# Patient Record
Sex: Female | Born: 1985 | Race: Black or African American | Hispanic: No | Marital: Married | Smoking: Never smoker
Health system: Southern US, Community
[De-identification: ages and names within clinical notes are randomized; demographics above are authoritative.]

## PROBLEM LIST (undated history)

## (undated) ENCOUNTER — Inpatient Hospital Stay (HOSPITAL_COMMUNITY): Payer: Self-pay

## (undated) DIAGNOSIS — O139 Gestational [pregnancy-induced] hypertension without significant proteinuria, unspecified trimester: Secondary | ICD-10-CM

## (undated) DIAGNOSIS — E119 Type 2 diabetes mellitus without complications: Secondary | ICD-10-CM

## (undated) DIAGNOSIS — O24419 Gestational diabetes mellitus in pregnancy, unspecified control: Secondary | ICD-10-CM

## (undated) DIAGNOSIS — G44009 Cluster headache syndrome, unspecified, not intractable: Secondary | ICD-10-CM

## (undated) DIAGNOSIS — O09299 Supervision of pregnancy with other poor reproductive or obstetric history, unspecified trimester: Secondary | ICD-10-CM

## (undated) HISTORY — DX: Type 2 diabetes mellitus without complications: E11.9

---

## 2011-01-26 DIAGNOSIS — O139 Gestational [pregnancy-induced] hypertension without significant proteinuria, unspecified trimester: Secondary | ICD-10-CM

## 2015-09-03 ENCOUNTER — Encounter (HOSPITAL_COMMUNITY): Payer: Self-pay | Admitting: *Deleted

## 2015-09-03 ENCOUNTER — Inpatient Hospital Stay (HOSPITAL_COMMUNITY)
Admission: AD | Admit: 2015-09-03 | Discharge: 2015-09-04 | Disposition: A | Discharge status: Home / Self Care | Payer: Medicaid Other | Source: Ambulatory Visit | Attending: Obstetrics and Gynecology | Admitting: Obstetrics and Gynecology

## 2015-09-03 DIAGNOSIS — R51 Headache: Secondary | ICD-10-CM | POA: Insufficient documentation

## 2015-09-03 DIAGNOSIS — Z3A09 9 weeks gestation of pregnancy: Secondary | ICD-10-CM | POA: Insufficient documentation

## 2015-09-03 DIAGNOSIS — O219 Vomiting of pregnancy, unspecified: Secondary | ICD-10-CM

## 2015-09-03 DIAGNOSIS — O210 Mild hyperemesis gravidarum: Secondary | ICD-10-CM | POA: Diagnosis not present

## 2015-09-03 DIAGNOSIS — G44209 Tension-type headache, unspecified, not intractable: Secondary | ICD-10-CM | POA: Diagnosis not present

## 2015-09-03 HISTORY — DX: Gestational (pregnancy-induced) hypertension without significant proteinuria, unspecified trimester: O13.9

## 2015-09-03 LAB — COMPREHENSIVE METABOLIC PANEL
ALBUMIN: 4.2 g/dL (ref 3.5–5.0)
ALK PHOS: 49 U/L (ref 38–126)
ALT: 14 U/L (ref 14–54)
AST: 23 U/L (ref 15–41)
Anion gap: 7 (ref 5–15)
BILIRUBIN TOTAL: 0.7 mg/dL (ref 0.3–1.2)
BUN: 9 mg/dL (ref 6–20)
CO2: 22 mmol/L (ref 22–32)
CREATININE: 0.51 mg/dL (ref 0.44–1.00)
Calcium: 8.5 mg/dL — ABNORMAL LOW (ref 8.9–10.3)
Chloride: 104 mmol/L (ref 101–111)
GFR calc Af Amer: >60 mL/min (ref >60)
GFR calc non Af Amer: >60 mL/min (ref >60)
GLUCOSE: 90 mg/dL (ref 65–99)
POTASSIUM: 4.1 mmol/L (ref 3.5–5.1)
Sodium: 133 mmol/L — ABNORMAL LOW (ref 135–145)
TOTAL PROTEIN: 7.7 g/dL (ref 6.5–8.1)

## 2015-09-03 LAB — CBC WITH DIFFERENTIAL/PLATELET
BASOS PCT: 1 %
Basophils Absolute: 0 10*3/uL (ref 0.0–0.1)
Eosinophils Absolute: 0 10*3/uL (ref 0.0–0.7)
Eosinophils Relative: 0 %
HEMATOCRIT: 36.5 % (ref 36.0–46.0)
HEMOGLOBIN: 12.2 g/dL (ref 12.0–15.0)
LYMPHS PCT: 41 %
Lymphs Abs: 2.2 10*3/uL (ref 0.7–4.0)
MCH: 26.2 pg (ref 26.0–34.0)
MCHC: 33.4 g/dL (ref 30.0–36.0)
MCV: 78.5 fL (ref 78.0–100.0)
MONO ABS: 0.4 10*3/uL (ref 0.1–1.0)
Monocytes Relative: 7 %
NEUTROS ABS: 2.7 10*3/uL (ref 1.7–7.7)
NEUTROS PCT: 51 %
Platelets: 259 10*3/uL (ref 150–400)
RBC: 4.65 MIL/uL (ref 3.87–5.11)
RDW: 14.1 % (ref 11.5–15.5)
WBC: 5.4 10*3/uL (ref 4.0–10.5)

## 2015-09-03 LAB — URINALYSIS, ROUTINE W REFLEX MICROSCOPIC
Bilirubin Urine: NEGATIVE
Glucose, UA: NEGATIVE mg/dL
HGB URINE DIPSTICK: NEGATIVE
Ketones, ur: 40 mg/dL — AB
LEUKOCYTES UA: NEGATIVE
Nitrite: NEGATIVE
PROTEIN: NEGATIVE mg/dL
UROBILINOGEN UA: 0.2 mg/dL (ref 0.0–1.0)
pH: 6 (ref 5.0–8.0)

## 2015-09-03 LAB — POCT PREGNANCY, URINE: PREG TEST UR: POSITIVE — AB

## 2015-09-03 MED ORDER — PROMETHAZINE HCL 25 MG/ML IJ SOLN
25.0000 mg | Freq: Once | INTRAVENOUS | Status: AC
  Administered 2015-09-03: 25 mg via INTRAVENOUS
  Filled 2015-09-03: qty 1

## 2015-09-03 MED ORDER — LACTATED RINGERS IV BOLUS (SEPSIS)
1000.0000 mL | Freq: Once | INTRAVENOUS | Status: AC
  Administered 2015-09-03: 1000 mL via INTRAVENOUS

## 2015-09-03 MED ORDER — METOCLOPRAMIDE HCL 5 MG/ML IJ SOLN
5.0000 mg | Freq: Once | INTRAMUSCULAR | Status: AC
  Administered 2015-09-03: 5 mg via INTRAVENOUS
  Filled 2015-09-03: qty 2

## 2015-09-03 NOTE — MAU Provider Note (Signed)
History    CSN: [id number] Arrival date and time: 09/03/15 1831  Chief Complaint  Patient presents with  . Headache  . Emesis During Pregnancy   HPI Vanessa Molina is 29 y.o.  LMP 06/29/15.  [id number]. By dates she is [redacted]w[redacted]d with EDD 04/04/16.  Presents with nausea and vomiting--reported 3 X today.  Difficulty with decreased appetite and keeping foods down.  She has hx of headaches and now having them daily. She denies vaginal bleeding and abdominal pain.  She had a + HPT and confirmed at [facility]  Has Appt mid Nov to begin prenatal care.  Hx of pre eclampsia with previous pregnancy.  Moved here from [place] in August.     Past Medical History  Diagnosis Date  . Pregnancy induced hypertension     Past Surgical History  Procedure Laterality Date  . Cesarean section      x2    No family history on file.  Social History  Substance Use Topics  . Smoking status: Never Smoker   . Smokeless tobacco: None  . Alcohol Use: No    Allergies: No Known Allergies  Prescriptions prior to admission  Medication Sig Dispense Refill Last Dose  . Prenatal Vit-Fe Fumarate-FA (PRENATAL MULTIVITAMIN) TABS tablet Take 1 tablet by mouth daily at 12 noon.   09/02/2015 at Unknown time    Review of Systems  Constitutional: Negative for fever and chills.  Gastrointestinal: Positive for nausea and vomiting. Negative for abdominal pain.  Genitourinary: Negative for dysuria, urgency, frequency and hematuria.       Neg for vaginal bleeding.  Neurological: Positive for headaches.   Physical Exam   Blood pressure 109/65, pulse 86, temperature 98.1 F (36.7 C), temperature source Oral, resp. rate 18, last menstrual period 06/29/2015.  Physical Exam  Nursing note and vitals reviewed. Constitutional: She is oriented to person, place, and time. She appears well-developed and well-nourished. No distress.  Not actively vomiting.  Cardiovascular: Normal rate.   Respiratory: Effort normal.  GI: Soft. There is no  tenderness.  Genitourinary:  Deferred.  Neurological: She is alert and oriented to person, place, and time. No cranial nerve deficit.  Skin: Skin is warm and dry.  Psychiatric: She has a normal mood and affect. Her behavior is normal.    Results for orders placed or performed during the hospital encounter of 09/03/15 (from the past 24 hour(s))  Urinalysis, Routine w reflex microscopic (not at [facility])     Status: Abnormal   Collection Time: 09/03/15  7:00 PM  Result Value Ref Range   Color, Urine YELLOW YELLOW   APPearance CLEAR CLEAR   Specific Gravity, Urine >1.030 (H) 1.005 - 1.030   pH 6.0 5.0 - 8.0   Glucose, UA NEGATIVE NEGATIVE mg/dL   Hgb urine dipstick NEGATIVE NEGATIVE   Bilirubin Urine NEGATIVE NEGATIVE   Ketones, ur 40 (A) NEGATIVE mg/dL   Protein, ur NEGATIVE NEGATIVE mg/dL   Urobilinogen, UA 0.2 0.0 - 1.0 mg/dL   Nitrite NEGATIVE NEGATIVE   Leukocytes, UA NEGATIVE NEGATIVE  Pregnancy, urine POC     Status: Abnormal   Collection Time: 09/03/15  7:14 PM  Result Value Ref Range   Preg Test, Ur POSITIVE (A) NEGATIVE  CBC with Differential/Platelet     Status: None   Collection Time: 09/03/15  9:20 PM  Result Value Ref Range   WBC 5.4 4.0 - 10.5 K/uL   RBC 4.65 3.87 - 5.11 MIL/uL   Hemoglobin 12.2 12.0 - 15.0 g/dL  HCT 36.5 36.0 - 46.0 %   MCV 78.5 78.0 - 100.0 fL   MCH 26.2 26.0 - 34.0 pg   MCHC 33.4 30.0 - 36.0 g/dL   RDW 46.914.1 62.911.5 - 52.815.5 %   Platelets 259 150 - 400 K/uL   Neutrophils Relative % 51 %   Neutro Abs 2.7 1.7 - 7.7 K/uL   Lymphocytes Relative 41 %   Lymphs Abs 2.2 0.7 - 4.0 K/uL   Monocytes Relative 7 %   Monocytes Absolute 0.4 0.1 - 1.0 K/uL   Eosinophils Relative 0 %   Eosinophils Absolute 0.0 0.0 - 0.7 K/uL   Basophils Relative 1 %   Basophils Absolute 0.0 0.0 - 0.1 K/uL  Comprehensive metabolic panel     Status: Abnormal   Collection Time: 09/03/15  9:20 PM  Result Value Ref Range   Sodium 133 (L) 135 - 145 mmol/L   Potassium 4.1 3.5 -  5.1 mmol/L   Chloride 104 101 - 111 mmol/L   CO2 22 22 - 32 mmol/L   Glucose, Bld 90 65 - 99 mg/dL   BUN 9 6 - 20 mg/dL   Creatinine, Ser 4.130.51 0.44 - 1.00 mg/dL   Calcium 8.5 (L) 8.9 - 10.3 mg/dL   Total Protein 7.7 6.5 - 8.1 g/dL   Albumin 4.2 3.5 - 5.0 g/dL   AST 23 15 - 41 U/L   ALT 14 14 - 54 U/L   Alkaline Phosphatase 49 38 - 126 U/L   Total Bilirubin 0.7 0.3 - 1.2 mg/dL   GFR calc non Af Amer >60 >60 mL/min   GFR calc Af Amer >60 >60 mL/min   Anion gap 7 5 - 15   MAU Course  Procedures  MDM MSE Labs- CMP wnl. CBC wnl. UA with keytones.  IV hydration with D5LR/Phenergan 25mg .     21:00 PM [facility] KnifeWilliams[facility] CNM. 21:59 PM Transferred care to Dr. Alvester MorinNewton - In brief, patient is here with early pregnancy nausea/vomiting and HA. She is receiving IVF.  11:16 PM Patient is still very nauseated and is s/p 1L D5LR and Phenergan. Will give 1 LR bolus and Reglan 5mg  IV and reassess.  0130: Denies nausea.   Assessment and Plan  A:  Emesis during pregnancy      Headaches  P: Patient is much improved after 2 L of fluid, phenergan and reglan. She is not endorsing nausea. Discussed importance of small frequent meals and hydration. Rx for phenergan given. [facility]. If phenergan does not control nausea Rx for reglan would be warranted.  Isa RankinKimberly Niles [facility] 09/03/2015, 10:03 PM

## 2015-09-03 NOTE — MAU Note (Addendum)
C/O HA since last night, vomiting every day since she found out she was pregnant.  Pt denies abd pain or bleeding.  [facility] HPT [facility]

## 2015-09-03 NOTE — MAU Note (Signed)
Hx of severe preelampsia with previous pregnancies.

## 2015-09-04 ENCOUNTER — Telehealth (HOSPITAL_COMMUNITY): Payer: Self-pay | Admitting: *Deleted

## 2015-09-04 MED ORDER — PROMETHAZINE HCL 25 MG PO TABS: 25.0000 mg | ORAL_TABLET | Freq: Four times a day (QID) | ORAL | Status: DC | PRN

## 2015-09-04 NOTE — Discharge Instructions (Signed)
Eating Plan for Hyperemesis Gravidarum Severe cases of hyperemesis gravidarum can lead to dehydration and malnutrition. The hyperemesis eating plan is one way to lessen the symptoms of nausea and vomiting. It is often used with prescribed medicines to control your symptoms.  WHAT CAN I DO TO RELIEVE MY SYMPTOMS? Listen to your body. Everyone is different and has different preferences. Find what works best for you. Some of the following things may help:  Eat and drink slowly.  Eat 5-6 small meals daily instead of 3 large meals.   Eat crackers before you get out of bed in the morning.   Starchy foods are usually well tolerated (such as cereal, toast, bread, potatoes, pasta, rice, and pretzels).   Ginger may help with nausea. Add  tsp ground ginger to hot tea or choose ginger tea.   Try drinking 100% fruit juice or an electrolyte drink.  Continue to take your prenatal vitamins as directed by your health care provider. If you are having trouble taking your prenatal vitamins, talk with your health care provider about different options.  Include at least 1 serving of protein with your meals and snacks (such as meats or poultry, beans, nuts, eggs, or yogurt). Try eating a protein-rich snack before bed (such as cheese and crackers or a half Malaw[place]urkey or peanut butter sandwich). WHAT THINGS SHOULD I AVOID TO REDUCE MY SYMPTOMS? The following things may help reduce your symptoms:  Avoid foods with strong smells. Try eating meals in well-ventilated areas that are free of odors.  Avoid drinking water or other beverages with meals. Try not to drink anything less than 30 minutes before and after meals.  Avoid drinking more than 1 cup of fluid at a time.  Avoid fried or high-fat foods, such as butter and cream sauces.  Avoid spicy foods.  Avoid skipping meals the best you can. Nausea can be more intense on an empty stomach. If you cannot tolerate food at that time, do not force it. Try sucking on  ice chips or other frozen items and make up the calories later.  Avoid lying down within 2 hours after eating.   This information is not intended to replace advice given to you by your health care provider. Make sure you discuss any questions you have with your health care provider.   Document Released: 08/20/2007 Document Revised: 10/28/2013 Document Reviewed: 08/27/2013 Elsevier Interactive Patient Education 2016 [employer]   Safe Medications in Pregnancy   Acne: Benzoyl Peroxide Salicylic Acid  Backache/Headache: Tylenol: 2 regular strength every 4 hours OR              2 Extra strength every 6 hours  Colds/Coughs/Allergies: Benadryl (alcohol free) 25 mg every 6 hours as needed Breath right strips Claritin Cepacol throat lozenges Chloraseptic throat spray Cold-Eeze- up to three times per day Cough drops, alcohol free Flonase (by prescription only) Guaifenesin Mucinex Robitussin DM (plain only, alcohol free) Saline nasal spray/drops Sudafed (pseudoephedrine) & Actifed ** use only after [redacted] weeks gestation and if you do not have high blood pressure Tylenol Vicks Vaporub Zinc lozenges Zyrtec   Constipation: Colace Ducolax suppositories Fleet enema Glycerin suppositories Metamucil Milk of magnesia Miralax Senokot Smooth move tea  Diarrhea: Kaopectate Imodium A-D  *NO pepto Bismol  Hemorrhoids: Anusol Anusol HC Preparation H Tucks  Indigestion: Tums Maalox Mylanta Zantac  Pepcid  Insomnia: Benadryl (alcohol free) 25mg  every 6 hours as needed Tylenol PM Unisom, no Gelcaps  Leg Cramps: Tums MagGel  Nausea/Vomiting:  Bonine Dramamine  Emetrol Ginger extract Sea bands Meclizine  Nausea medication to take during pregnancy:  Unisom (doxylamine succinate 25 mg tablets) Take one tablet daily at bedtime. If symptoms are not adequately controlled, the dose can be increased to a maximum recommended dose of two tablets daily (1/2 tablet in the  morning, 1/2 tablet mid-afternoon and one at bedtime). Vitamin B6  tablets. Take one tablet twice a day (up to 200 mg per day).  Skin Rashes: Aveeno products Benadryl cream or  every 6 hours as needed Calamine Lotion 1% cortisone cream  Yeast infection: Gyne-lotrimin 7 Monistat 7   **If taking multiple medications, please check labels to avoid duplicating the same active ingredients **take medication as directed on the label ** Do not exceed 4000 mg of tylenol in 24 hours **Do not take medications that contain aspirin or ibuprofen     Hyperemesis Gravidarum Hyperemesis gravidarum is a severe form of nausea and vomiting that happens during pregnancy. Hyperemesis is worse than morning sickness. It may cause you to have nausea or vomiting all day for many days. It may keep you from eating and drinking enough food and liquids. Hyperemesis usually occurs during the first half (the first 20 weeks) of pregnancy. It often goes away once a woman is in her second half of pregnancy. However, sometimes hyperemesis continues through an entire pregnancy.  CAUSES  The cause of this condition is not completely known but is thought to be related to changes in the body's hormones when pregnant. It could be from the high level of the pregnancy hormone or an increase in estrogen in the body.  SIGNS AND SYMPTOMS   Severe nausea and vomiting.  Nausea that does not go away.  Vomiting that does not allow you to keep any food down.  Weight loss and body fluid loss (dehydration).  Having no desire to eat or not liking food you have previously enjoyed. DIAGNOSIS  Your health care provider will do a physical exam and ask you about your symptoms. He or she may also order blood tests and urine tests to make sure something else is not causing the problem.  TREATMENT  You may only need medicine to control the problem. If medicines do not control the nausea and vomiting, you will be treated in the  hospital to prevent dehydration, increased acid in the blood (acidosis), weight loss, and changes in the electrolytes in your body that may harm the unborn baby (fetus). You may need IV fluids.  HOME CARE INSTRUCTIONS   Only take over-the-counter or prescription medicines as directed by your health care provider.  Try eating a couple of dry crackers or toast in the morning before getting out of bed.  Avoid foods and smells that upset your stomach.  Avoid fatty and spicy foods.  Eat 5-6 small meals a day.  Do not drink when eating meals. Drink between meals.  For snacks, eat high-protein foods, such as cheese.  Eat or suck on things that have ginger in them. Ginger helps nausea.  Avoid food preparation. The smell of food can spoil your appetite.  Avoid iron pills and iron in your multivitamins until after 3-4 months of being pregnant. However, consult with your health care provider before stopping any prescribed iron pills. SEEK MEDICAL CARE IF:   Your abdominal pain increases.  You have a severe headache.  You have vision problems.  You are losing weight. SEEK IMMEDIATE MEDICAL CARE IF:   You are unable to keep fluids down.  You  vomit blood.  You have constant nausea and vomiting.  You have excessive weakness.  You have extreme thirst.  You have dizziness or fainting.  You have a fever or persistent symptoms for more than 2-3 days.  You have a fever and your symptoms suddenly get worse. MAKE SURE YOU:   Understand these instructions.  Will watch your condition.  Will get help right away if you are not doing well or get worse.   This information is not intended to replace advice given to you by your health care provider. Make sure you discuss any questions you have with your health care provider.   Document Released: 10/23/2005 Document Revised: 08/13/2013 Document Reviewed: 06/04/2013 Elsevier Interactive Patient Education [employer]

## 2015-09-04 NOTE — Progress Notes (Signed)
Written and verbal d/c instructions given and understanding voiced. 

## 2015-09-07 ENCOUNTER — Inpatient Hospital Stay (HOSPITAL_COMMUNITY)
Admission: AD | Admit: 2015-09-07 | Discharge: 2015-09-07 | Disposition: A | Discharge status: Home / Self Care | Payer: Medicaid Other | Source: Ambulatory Visit | Attending: Obstetrics & Gynecology | Admitting: Obstetrics & Gynecology

## 2015-09-07 ENCOUNTER — Encounter (HOSPITAL_COMMUNITY): Payer: Self-pay | Admitting: *Deleted

## 2015-09-07 ENCOUNTER — Inpatient Hospital Stay (HOSPITAL_COMMUNITY): Payer: Medicaid Other

## 2015-09-07 DIAGNOSIS — O43891 Other placental disorders, first trimester: Secondary | ICD-10-CM

## 2015-09-07 DIAGNOSIS — O209 Hemorrhage in early pregnancy, unspecified: Secondary | ICD-10-CM | POA: Diagnosis present

## 2015-09-07 DIAGNOSIS — O208 Other hemorrhage in early pregnancy: Secondary | ICD-10-CM | POA: Diagnosis not present

## 2015-09-07 DIAGNOSIS — Z3A10 10 weeks gestation of pregnancy: Secondary | ICD-10-CM | POA: Insufficient documentation

## 2015-09-07 DIAGNOSIS — N90810 Female genital mutilation status, unspecified: Secondary | ICD-10-CM | POA: Insufficient documentation

## 2015-09-07 DIAGNOSIS — O468X1 Other antepartum hemorrhage, first trimester: Secondary | ICD-10-CM

## 2015-09-07 DIAGNOSIS — O418X10 Other specified disorders of amniotic fluid and membranes, first trimester, not applicable or unspecified: Secondary | ICD-10-CM

## 2015-09-07 LAB — CBC
HCT: 37 % (ref 36.0–46.0)
Hemoglobin: 12.4 g/dL (ref 12.0–15.0)
MCH: 26.6 pg (ref 26.0–34.0)
MCHC: 33.5 g/dL (ref 30.0–36.0)
MCV: 79.4 fL (ref 78.0–100.0)
PLATELETS: 269 10*3/uL (ref 150–400)
RBC: 4.66 MIL/uL (ref 3.87–5.11)
RDW: 14 % (ref 11.5–15.5)
WBC: 5.3 10*3/uL (ref 4.0–10.5)

## 2015-09-07 LAB — URINALYSIS, ROUTINE W REFLEX MICROSCOPIC
Bilirubin Urine: NEGATIVE
Glucose, UA: NEGATIVE mg/dL
Ketones, ur: NEGATIVE mg/dL
Leukocytes, UA: NEGATIVE
NITRITE: NEGATIVE
PROTEIN: NEGATIVE mg/dL
SPECIFIC GRAVITY, URINE: 1.025 (ref 1.005–1.030)
UROBILINOGEN UA: 0.2 mg/dL (ref 0.0–1.0)
pH: 6 (ref 5.0–8.0)

## 2015-09-07 LAB — URINE MICROSCOPIC-ADD ON

## 2015-09-07 LAB — WET PREP, GENITAL
Clue Cells Wet Prep HPF POC: NONE SEEN
TRICH WET PREP: NONE SEEN
Yeast Wet Prep HPF POC: NONE SEEN

## 2015-09-07 LAB — ABO/RH: ABO/RH(D): O POS

## 2015-09-07 LAB — HCG, QUANTITATIVE, PREGNANCY: HCG, BETA CHAIN, QUANT, S: 39140 m[IU]/mL — AB (ref <5)

## 2015-09-07 NOTE — MAU Note (Signed)
Not in the lobby 

## 2015-09-07 NOTE — MAU Note (Signed)
Urine in lab 

## 2015-09-07 NOTE — Discharge Instructions (Signed)
Subchorionic Hematoma °A subchorionic hematoma is a gathering of blood between the outer wall of the placenta and the inner wall of the womb (uterus). The placenta is the organ that connects the fetus to the wall of the uterus. The placenta performs the feeding, breathing (oxygen to the fetus), and waste removal (excretory work) of the fetus.  °Subchorionic hematoma is the most common abnormality found on a result from ultrasonography done during the first trimester or early second trimester of pregnancy. If there has been little or no vaginal bleeding, early small hematomas usually shrink on their own and do not affect your baby or pregnancy. The blood is gradually absorbed over 1-2 weeks. When bleeding starts later in pregnancy or the hematoma is larger or occurs in an older pregnant woman, the outcome may not be as good. Larger hematomas may get bigger, which increases the chances for miscarriage. Subchorionic hematoma also increases the risk of premature detachment of the placenta from the uterus, preterm (premature) labor, and stillbirth. °HOME CARE INSTRUCTIONS °· Stay on bed rest if your health care provider recommends this. Although bed rest will not prevent more bleeding or prevent a miscarriage, your health care provider may recommend bed rest until you are advised otherwise. °· Avoid heavy lifting (more than 10 lb [4.5 kg]), exercise, sexual intercourse, or douching as directed by your health care provider. °· Keep track of the number of pads you use each day and how soaked (saturated) they are. Write down this information. °· Do not use tampons. °· Keep all follow-up appointments as directed by your health care provider. Your health care provider may ask you to have follow-up blood tests or ultrasound tests or both. °SEEK IMMEDIATE MEDICAL CARE IF: °· You have severe cramps in your stomach, back, abdomen, or pelvis. °· You have a fever. °· You pass large clots or tissue. Save any tissue for your health  care provider to look at. °· Your bleeding increases or you become lightheaded, feel weak, or have fainting episodes. °  °This information is not intended to replace advice given to you by your health care provider. Make sure you discuss any questions you have with your health care provider. °  °Document Released: 02/07/2007 Document Revised: 11/13/2014 Document Reviewed: 05/22/2013 °Elsevier Interactive Patient Education ©2016 Elsevier Inc. ° °

## 2015-09-07 NOTE — MAU Note (Signed)
Bleeding this morning and she is pregnant.  This is first time she has had bleeding.

## 2015-09-07 NOTE — MAU Note (Signed)
Some nausea and vomiting continues, thought is much better now with medication. Denies diarrhea or constipation.  No urinary symptoms

## 2015-09-07 NOTE — MAU Note (Signed)
Pt had 2 episodes of having a gush of blood but denies any bleeding at present; denies any pain; needs Print production plannerArabic translator;

## 2015-09-07 NOTE — MAU Provider Note (Signed)
History     CSN: [id number]  Arrival date and time: 09/07/15 1224   First Provider Initiated Contact with Patient 09/07/15 1450         Chief Complaint  Patient presents with  . Vaginal Bleeding   HPI Vanessa Molina is a 29 y.o. G3P1011 at [redacted]w[redacted]d who presents with vaginal bleeding.  Pt reports 2 episodes of vaginal bleeding since yesterday. Had a gush of red bleeding, last episode was this morning. Currently not bleeding.  Denies abdominal pain.  Last intercourse 3 days ago.     OB History    Gravida Para Term Preterm AB TAB SAB Ectopic Multiple Living   Past Medical History  Diagnosis Date  . Pregnancy induced hypertension     Past Surgical History  Procedure Laterality Date  . Cesarean section      x2    Family History  Problem Relation Age of Onset  . Diabetes Maternal Aunt   . Diabetes Maternal Uncle   . Kidney disease Paternal Aunt   . Diabetes Paternal Aunt   . Kidney disease Paternal Uncle   . Diabetes Paternal Uncle     Social History  Substance Use Topics  . Smoking status: Never Smoker   . Smokeless tobacco: None  . Alcohol Use: No    Allergies: No Known Allergies  Prescriptions prior to admission  Medication Sig Dispense Refill Last Dose  . acetaminophen (TYLENOL) 325 MG tablet Take 650 mg by mouth every 6 (six) hours as needed for mild pain or headache.   09/06/2015 at Unknown time  . Prenatal Vit-Fe Fumarate-FA (PRENATAL MULTIVITAMIN) TABS tablet Take 1 tablet by mouth daily at 12 noon.   09/06/2015 at Unknown time  . promethazine (PHENERGAN) 25 MG tablet Take 1 tablet (25 mg total) by mouth every 6 (six) hours as needed for nausea or vomiting. 30 tablet 2 Past Week at Unknown time    Review of Systems  Constitutional: Negative.   Gastrointestinal: Negative.   Genitourinary:       + vaginal bleeding   Physical Exam   Blood pressure 113/75, pulse 89, temperature 98.2 F (36.8 C), temperature source Oral, resp. rate  16, height 5' 2.5" (1.588 m), weight 141 lb 12.8 oz (64.32 kg), last menstrual period 06/29/2015.  Physical Exam  Nursing note and vitals reviewed. Constitutional: She is oriented to person, place, and time. She appears well-developed and well-nourished. No distress.  HENT:  Head: Normocephalic and atraumatic.  Eyes: Conjunctivae are normal. Right eye exhibits no discharge. Left eye exhibits no discharge. No scleral icterus.  Neck: Normal range of motion.  Cardiovascular: Normal rate, regular rhythm and normal heart sounds.   No murmur heard. Respiratory: Effort normal and breath sounds normal. No respiratory distress. She has no wheezes.  GI: Soft. Bowel sounds are normal. She exhibits no distension. There is no tenderness.  Genitourinary: Uterus normal. Cervix exhibits discharge. Cervix exhibits no motion tenderness and no friability.  Small introitus r/t FGM Cervix closed Minimal amount of bloody mucous slowly oozing from os  Neurological: She is alert and oriented to person, place, and time.  Skin: Skin is warm and dry. She is not diaphoretic.  Psychiatric: She has a normal mood and affect. Her behavior is normal. Judgment and thought content normal.    MAU Course  Procedures Results for orders placed or performed during the hospital encounter of 09/07/15 (from the past 24  hour(s))  CBC     Status: None   Collection Time: 09/07/15 12:50 PM  Result Value Ref Range   WBC 5.3 4.0 - 10.5 K/uL   RBC 4.66 3.87 - 5.11 MIL/uL   Hemoglobin 12.4 12.0 - 15.0 g/dL   HCT 96.0 45.4 - 09.8 %   MCV 79.4 78.0 - 100.0 fL   MCH 26.6 26.0 - 34.0 pg   MCHC 33.5 30.0 - 36.0 g/dL   RDW 11.9 14.7 - 82.9 %   Platelets 269 150 - 400 K/uL  hCG, quantitative, pregnancy     Status: Abnormal   Collection Time: 09/07/15 12:50 PM  Result Value Ref Range   hCG, Beta Chain, Quant, S 39140 (H) <5 mIU/mL  ABO/Rh     Status: None   Collection Time: 09/07/15 12:50 PM  Result Value Ref Range   ABO/RH(D) O  POS   Urinalysis, Routine w reflex microscopic (not at [facility])     Status: Abnormal   Collection Time: 09/07/15  1:05 PM  Result Value Ref Range   Color, Urine YELLOW YELLOW   APPearance CLEAR CLEAR   Specific Gravity, Urine 1.025 1.005 - 1.030   pH 6.0 5.0 - 8.0   Glucose, UA NEGATIVE NEGATIVE mg/dL   Hgb urine dipstick TRACE (A) NEGATIVE   Bilirubin Urine NEGATIVE NEGATIVE   Ketones, ur NEGATIVE NEGATIVE mg/dL   Protein, ur NEGATIVE NEGATIVE mg/dL   Urobilinogen, UA 0.2 0.0 - 1.0 mg/dL   Nitrite NEGATIVE NEGATIVE   Leukocytes, UA NEGATIVE NEGATIVE  Urine microscopic-add on     Status: Abnormal   Collection Time: 09/07/15  1:05 PM  Result Value Ref Range   Squamous Epithelial / LPF FEW (A) RARE   WBC, UA 0-2 <3 WBC/hpf   RBC / HPF 3-6 <3 RBC/hpf   Bacteria, UA RARE RARE   Urine-Other MUCOUS PRESENT   Wet prep, genital     Status: Abnormal   Collection Time: 09/07/15  3:00 PM  Result Value Ref Range   Yeast Wet Prep HPF POC NONE SEEN NONE SEEN   Trich, Wet Prep NONE SEEN NONE SEEN   Clue Cells Wet Prep HPF POC NONE SEEN NONE SEEN   WBC, Wet Prep HPF POC FEW (A) NONE SEEN   US Ob Comp Less 14 Wks  09/07/2015  CLINICAL DATA:  Bleeding EXAM: OBSTETRIC <14 WK [place] AND TRANSVAGINAL OB US TECHNIQUE: Both transabdominal and transvaginal ultrasound examinations were performed for complete evaluation of the gestation as well as the maternal uterus, adnexal regions, and pelvic cul-de-sac. Transvaginal technique was performed to assess early pregnancy. COMPARISON:  None. FINDINGS: Intrauterine gestational sac: Visualized, irregularly-shaped. Yolk sac:  Visualized Embryo:  Not definitively visualized Cardiac Activity: Not visualized Heart Rate:   bpm MSD: 22.8  mm   7 w   2  d CRL:    mm    w    d                  [place] EDC: Maternal uterus/adnexae: Small to moderate subchorionic hemorrhage. No adnexal masses. Trace free fluid in the pelvis. IMPRESSION: Irregularly-shaped intrauterine gestational sac.  No definite embryo. Findings are suspicious but not yet definitive for failed pregnancy. Recommend follow-up US in 10-14 days for definitive diagnosis. This recommendation follows SRU consensus guidelines: Diagnostic Criteria for Nonviable Pregnancy Early in the First Trimester. Malva Limes Med 2013; 562:1308-65. Electronically Signed   By: Charlett Nose M.D.   On: 09/07/2015 16:27  [place] Ob Transvaginal  09/07/2015  CLINICAL DATA:  Bleeding EXAM: OBSTETRIC <14 WK US AND TRANSVAGINAL OB US TECHNIQUE: Both transabdominal and transvaginal ultrasound examinations were performed for complete evaluation of the gestation as well as the maternal uterus, adnexal regions, and pelvic cul-de-sac. Transvaginal technique was performed to assess early pregnancy. COMPARISON:  None. FINDINGS: Intrauterine gestational sac: Visualized, irregularly-shaped. Yolk sac:  Visualized Embryo:  Not definitively visualized Cardiac Activity: Not visualized Heart Rate:   bpm MSD: 22.8  mm   7 w   2  d CRL:    mm    w    d                  US EDC: Maternal uterus/adnexae: Small to moderate subchorionic hemorrhage. No adnexal masses. Trace free fluid in the pelvis. IMPRESSION: Irregularly-shaped intrauterine gestational sac. No definite embryo. Findings are suspicious but not yet definitive for failed pregnancy. Recommend follow-up US in 10-14 days for definitive diagnosis. This recommendation follows SRU consensus guidelines: Diagnostic Criteria for Nonviable Pregnancy Early in the First Trimester. Malva Limes Engl J Med 2013; 409:8119-14; 369:1443-51. Electronically Signed   By: Charlett NoseKevin  Dover M.D.   On: 09/07/2015 16:27     MDM O positive Will bring pt back in 10-14 days for ultrasound per radiologist recommendation  Assessment and Plan  A: 1. Subchorionic hematoma in first trimester   2. Vaginal bleeding in pregnancy, first trimester    P: Discharge home Discussed reasons to return to MAU S/s SAB Outpatient ultrasound in 10-14 days ordered  Judeth HornErin  Viveka Wilmeth, NP  09/07/2015, 2:50 PM

## 2015-09-08 LAB — HIV ANTIBODY (ROUTINE TESTING W REFLEX): HIV Screen 4th Generation wRfx: NONREACTIVE

## 2015-09-08 LAB — GC/CHLAMYDIA PROBE AMP (~~LOC~~) NOT AT ARMC
Chlamydia: NEGATIVE
Neisseria Gonorrhea: NEGATIVE

## 2015-09-13 ENCOUNTER — Inpatient Hospital Stay (HOSPITAL_COMMUNITY)
Admission: AD | Admit: 2015-09-13 | Discharge: 2015-09-13 | Disposition: A | Discharge status: Home / Self Care | Payer: Medicaid Other | Source: Ambulatory Visit | Attending: Family Medicine | Admitting: Family Medicine

## 2015-09-13 ENCOUNTER — Encounter (HOSPITAL_COMMUNITY): Payer: Self-pay | Admitting: *Deleted

## 2015-09-13 DIAGNOSIS — Z3A10 10 weeks gestation of pregnancy: Secondary | ICD-10-CM | POA: Insufficient documentation

## 2015-09-13 DIAGNOSIS — R102 Pelvic and perineal pain: Secondary | ICD-10-CM | POA: Diagnosis present

## 2015-09-13 DIAGNOSIS — O039 Complete or unspecified spontaneous abortion without complication: Secondary | ICD-10-CM | POA: Insufficient documentation

## 2015-09-13 LAB — HCG, QUANTITATIVE, PREGNANCY: HCG, BETA CHAIN, QUANT, S: 21117 m[IU]/mL — AB (ref <5)

## 2015-09-13 MED ORDER — IBUPROFEN 800 MG PO TABS: 800.0000 mg | ORAL_TABLET | Freq: Three times a day (TID) | ORAL | Status: DC | PRN

## 2015-09-13 MED ORDER — HYDROCODONE-ACETAMINOPHEN 5-325 MG PO TABS: 2.0000 | ORAL_TABLET | ORAL | Status: DC | PRN

## 2015-09-13 MED ORDER — OXYCODONE-ACETAMINOPHEN 5-325 MG PO TABS
2.0000 | ORAL_TABLET | Freq: Once | ORAL | Status: AC
  Administered 2015-09-13: 2 via ORAL
  Filled 2015-09-13: qty 2

## 2015-09-13 NOTE — MAU Note (Signed)
Pt. States she experienced bleeding last week and came here for evaluation. Pt. States they told her that if she had no more bleeding she was to come back in 10days for a repeat US. Started to bleed and cramp tonight and is here for evaluation.

## 2015-09-13 NOTE — MAU Provider Note (Signed)
History    CSN: [id number]  Arrival date & time 09/13/15  [id number]   First Provider Initiated Contact with Patient 09/13/15 1936      No chief complaint on file.   HPI Comments:  G3P1101 at 10.6 wks in with bleeding and cramping that started today. Had smilar but less intense episode on 09/07/15 wher u/s showed probable  miscarriage.  Pelvic Pain The patient's primary symptoms include pelvic pain and vaginal bleeding. This is a new problem. The current episode started today. The problem occurs constantly. The problem has been unchanged. The pain is severe. The problem affects both sides. She is pregnant. Associated symptoms include abdominal pain. The vaginal discharge was bloody. The vaginal bleeding is typical of menses. She has not been passing clots. She has not been passing tissue. Nothing aggravates the symptoms. She has tried nothing for the symptoms.    Past Medical History  Diagnosis Date  . Pregnancy induced hypertension     Past Surgical History  Procedure Laterality Date  . Cesarean section      x2    Family History  Problem Relation Age of Onset  . Diabetes Maternal Aunt   . Diabetes Maternal Uncle   . Kidney disease Paternal Aunt   . Diabetes Paternal Aunt   . Kidney disease Paternal Uncle   . Diabetes Paternal Uncle     Social History  Substance Use Topics  . Smoking status: Never Smoker   . Smokeless tobacco: Not on file  . Alcohol Use: No    OB History    Gravida Para Term Preterm AB TAB SAB Ectopic Multiple Living   3 1 1  1  1   1       Review of Systems  Constitutional: Negative.   HENT: Negative.   Eyes: Negative.   Respiratory: Negative.   Cardiovascular: Negative.   Gastrointestinal: Positive for abdominal pain.  Genitourinary: Positive for vaginal bleeding and pelvic pain.  Musculoskeletal: Negative.   Skin: Negative.   Neurological: Negative.   Hematological: Negative.   Psychiatric/Behavioral: Negative.     Allergies  Review of  patient's allergies indicates no known allergies.  Home Medications  No current outpatient prescriptions on file.  BP 121/71 mmHg  Pulse 78  Temp(Src) 98.2 F (36.8 C) (Oral)  Resp 16  Ht 5' 2.75" (1.594 m)  Wt 142 lb 2 oz (64.467 kg)  BMI 25.37 kg/m2  LMP 06/29/2015  Physical Exam  Constitutional: She is oriented to person, place, and time. She appears well-developed and well-nourished.  HENT:  Head: Normocephalic.  Eyes: Pupils are equal, round, and reactive to light.  Neck: Normal range of motion.  Cardiovascular: Normal rate, regular rhythm, normal heart sounds and intact distal pulses.   Pulmonary/Chest: Effort normal and breath sounds normal.  Abdominal: Soft. Bowel sounds are normal.  Genitourinary: Vagina normal and uterus normal.  Musculoskeletal: Normal range of motion.  Neurological: She is alert and oriented to person, place, and time. She has normal reflexes.  Skin: Skin is warm and dry.  Psychiatric: She has a normal mood and affect. Her behavior is normal. Judgment and thought content normal.   Results for Carolyne FiscalMUSA, Myisha (MRN [id number]) as of 09/13/2015 20:53  Ref. Range 09/07/2015 12:50 09/13/2015 19:45  HCG, Beta Chain, Quant, S Latest Ref Range: <5 mIU/mL 39140 (H) 21117 (H)    Results for orders placed or performed during the hospital encounter of 09/13/15 (from the past 24 hour(s))  hCG, quantitative, pregnancy  Status: Abnormal   Collection Time: 09/13/15  7:45 PM  Result Value Ref Range   hCG, Beta Chain, Quant, S 21117 (H) <5 mIU/mL    MAU Course  Procedures (including critical care time)  D/W the patient at length regarding decreasing HCG. Offered cytotec v expectant management. Patient would like to proceed with expectant management.     MDM  Quant Hcg . Sterile spec exam done smamt dark bleeding with small clots, cervical os had sm dark dime size clot but no tissue. Lab pnding pt care and report to Zorita Pang CNM  1. Spontaneous abortion in  first trimester    DC home Comfort measures reviewed  1st/2nd/3rd Trimester precautions  Bleeding precautions RX: ibuprofen  #30, vicodin #30  Return to MAU as needed   Follow-up Information    Follow up with [facility] In 2 weeks.   Specialty:  Obstetrics and Gynecology   Why:  They will call you with an appointment    Contact information:   [address] [place] [address] [phone]

## 2015-09-13 NOTE — Discharge Instructions (Signed)
Miscarriage  A miscarriage is the sudden loss of an unborn baby (fetus) before the 20th week of pregnancy. Most miscarriages happen in the first 3 months of pregnancy. Sometimes, it happens before a woman even knows she is pregnant. A miscarriage is also called a "spontaneous miscarriage" or "early pregnancy loss." Having a miscarriage can be an emotional experience. Talk with your caregiver about any questions you may have about miscarrying, the grieving process, and your future pregnancy plans.  CAUSES    Problems with the fetal chromosomes that make it impossible for the baby to develop normally. Problems with the baby's genes or chromosomes are most often the result of errors that occur, by chance, as the embryo divides and grows. The problems are not inherited from the parents.   Infection of the cervix or uterus.    Hormone problems.    Problems with the cervix, such as having an incompetent cervix. This is when the tissue in the cervix is not strong enough to hold the pregnancy.    Problems with the uterus, such as an abnormally shaped uterus, uterine fibroids, or congenital abnormalities.    Certain medical conditions.    Smoking, drinking alcohol, or taking illegal drugs.    Trauma.   Often, the cause of a miscarriage is unknown.   SYMPTOMS    Vaginal bleeding or spotting, with or without cramps or pain.   Pain or cramping in the abdomen or lower back.   Passing fluid, tissue, or blood clots from the vagina.  DIAGNOSIS   Your caregiver will perform a physical exam. You may also have an ultrasound to confirm the miscarriage. Blood or urine tests may also be ordered.  TREATMENT    Sometimes, treatment is not necessary if you naturally pass all the fetal tissue that was in the uterus. If some of the fetus or placenta remains in the body (incomplete miscarriage), tissue left behind may become infected and must be removed. Usually, a dilation and curettage (D and C) procedure is performed.  During a D and C procedure, the cervix is widened (dilated) and any remaining fetal or placental tissue is gently removed from the uterus.   Antibiotic medicines are prescribed if there is an infection. Other medicines may be given to reduce the size of the uterus (contract) if there is a lot of bleeding.   If you have Rh negative blood and your baby was Rh positive, you will need a Rh immunoglobulin shot. This shot will protect any future baby from having Rh blood problems in future pregnancies.  HOME CARE INSTRUCTIONS    Your caregiver may order bed rest or may allow you to continue light activity. Resume activity as directed by your caregiver.   Have someone help with home and family responsibilities during this time.    Keep track of the number of sanitary pads you use each day and how soaked (saturated) they are. Write down this information.    Do not use tampons. Do not douche or have sexual intercourse until approved by your caregiver.    Only take over-the-counter or prescription medicines for pain or discomfort as directed by your caregiver.    Do not take aspirin. Aspirin can cause bleeding.    Keep all follow-up appointments with your caregiver.    If you or your partner have problems with grieving, talk to your caregiver or seek counseling to help cope with the pregnancy loss. Allow enough time to grieve before trying to get pregnant again.     SEEK IMMEDIATE MEDICAL CARE IF:    You have severe cramps or pain in your back or abdomen.   You have a fever.   You pass large blood clots (walnut-sized or larger) ortissue from your vagina. Save any tissue for your caregiver to inspect.    Your bleeding increases.    You have a thick, bad-smelling vaginal discharge.   You become lightheaded, weak, or you faint.    You have chills.   MAKE SURE YOU:   Understand these instructions.   Will watch your condition.   Will get help right away if you are not doing well or get worse.     This  information is not intended to replace advice given to you by your health care provider. Make sure you discuss any questions you have with your health care provider.     Document Released: 04/18/2001 Document Revised: 02/17/2013 Document Reviewed: 12/12/2011  Elsevier Interactive Patient Education 2016 Elsevier Inc.

## 2015-09-14 ENCOUNTER — Encounter: Payer: Self-pay | Admitting: Obstetrics and Gynecology

## 2015-09-17 ENCOUNTER — Ambulatory Visit (HOSPITAL_COMMUNITY): Payer: Medicaid Other

## 2015-10-04 ENCOUNTER — Ambulatory Visit (INDEPENDENT_AMBULATORY_CARE_PROVIDER_SITE_OTHER): Payer: Medicaid Other | Admitting: Obstetrics and Gynecology

## 2015-10-04 ENCOUNTER — Encounter: Payer: Self-pay | Admitting: Obstetrics and Gynecology

## 2015-10-04 VITALS — BP 124/73 | HR 81 | Temp 98.0°F | Wt 142.0 lb

## 2015-10-04 DIAGNOSIS — O039 Complete or unspecified spontaneous abortion without complication: Secondary | ICD-10-CM

## 2015-10-04 MED ORDER — NORGESTIMATE-ETH ESTRADIOL 0.25-35 MG-MCG PO TABS: 1.0000 | ORAL_TABLET | Freq: Every day | ORAL | Status: DC

## 2015-10-04 NOTE — Progress Notes (Signed)
Patient ID: Vanessa Molina, female   DOB: 05/05/1986, 29 y.o.   MRN: [id number] 29 yo G3P2011 presented as an MAU follow up following the diagnosis of SAB. Patient reports vaginal bleeding for 7 days in early November. No bleeding since. She denies any abdominal/pelvic pain. This was a desired pregnancy but not planned  Past Medical History  Diagnosis Date  . Pregnancy induced hypertension    Past Surgical History  Procedure Laterality Date  . Cesarean section      x2   Family History  Problem Relation Age of Onset  . Diabetes Maternal Aunt   . Diabetes Maternal Uncle   . Kidney disease Paternal Aunt   . Diabetes Paternal Aunt   . Kidney disease Paternal Uncle   . Diabetes Paternal Uncle    Social History  Substance Use Topics  . Smoking status: Never Smoker   . Smokeless tobacco: None  . Alcohol Use: No   ROS See pertinent in HPI  Blood pressure 124/73, pulse 81, temperature 98 F (36.7 C), weight 142 lb (64.411 kg), last menstrual period 06/29/2015. GENERAL: Well-developed, well-nourished female in no acute distress.  ABDOMEN: Soft, nontender, nondistended. No organomegaly. PELVIC: Normal external female genitalia. Vagina is pink and rugated.  Normal discharge. Normal appearing cervix. Uterus is normal in size. No adnexal mass or tenderness. EXTREMITIES: No cyanosis, clubbing, or edema, 2+ distal pulses.   A/P 29 yo s/p SAB on 11/7 here for follow up - Will obtain quant HCG today and weekly until negative - Rx Sprintec provided per patient requests. She has used OCP in the past without complications - Continue prenatal vitamins - RTC prn

## 2015-10-05 LAB — HCG, QUANTITATIVE, PREGNANCY: HCG, BETA CHAIN, QUANT, S: 15.6 m[IU]/mL — AB

## 2015-10-06 ENCOUNTER — Telehealth: Payer: Self-pay | Admitting: General Practice

## 2015-10-06 DIAGNOSIS — O039 Complete or unspecified spontaneous abortion without complication: Secondary | ICD-10-CM

## 2015-10-06 NOTE — Telephone Encounter (Signed)
Patient's bhcg is decreasing but she should return next week for repeat lab per Dr Jolayne Pantheronstant. Called patient with [employer] interpreter [phone], no answer- left message stating we are trying to reach you with results, please call us back at the clinics

## 2015-10-13 NOTE — Telephone Encounter (Signed)
Called patient using [employer] interpreter [phone]. Informed her that per Dr Jolayne Pantheronstant, her b-hcg levels are dropping but she needs to return one more time this week to have another level drawn. Patient stated she could come in tomorrow around 1100. Will notify front desk to add her to the lab schedule.

## 2015-10-14 ENCOUNTER — Other Ambulatory Visit: Payer: Medicaid Other

## 2015-10-14 DIAGNOSIS — O039 Complete or unspecified spontaneous abortion without complication: Secondary | ICD-10-CM

## 2015-10-15 ENCOUNTER — Telehealth: Payer: Self-pay | Admitting: General Practice

## 2015-10-15 LAB — HCG, QUANTITATIVE, PREGNANCY: HCG, BETA CHAIN, QUANT, S: 4.3 m[IU]/mL

## 2015-10-15 NOTE — Telephone Encounter (Signed)
Per Dr Jolayne Pantheronstant, patient's lab indicates resolution of pregnancy. Called patient with [employer] interpreter [phone] and informed her of results. Patient verbalized understanding & had no questions

## 2016-04-02 ENCOUNTER — Emergency Department (HOSPITAL_COMMUNITY)
Admission: EM | Admit: 2016-04-02 | Discharge: 2016-04-02 | Disposition: A | Discharge status: Home / Self Care | Payer: Medicaid Other | Attending: Emergency Medicine | Admitting: Emergency Medicine

## 2016-04-02 ENCOUNTER — Emergency Department (HOSPITAL_COMMUNITY): Payer: Medicaid Other

## 2016-04-02 ENCOUNTER — Encounter (HOSPITAL_COMMUNITY): Payer: Self-pay

## 2016-04-02 DIAGNOSIS — R51 Headache: Secondary | ICD-10-CM | POA: Diagnosis present

## 2016-04-02 DIAGNOSIS — R519 Headache, unspecified: Secondary | ICD-10-CM

## 2016-04-02 DIAGNOSIS — R1110 Vomiting, unspecified: Secondary | ICD-10-CM | POA: Insufficient documentation

## 2016-04-02 HISTORY — DX: Cluster headache syndrome, unspecified, not intractable: G44.009

## 2016-04-02 MED ORDER — SODIUM CHLORIDE 0.9 % IV BOLUS (SEPSIS)
1000.0000 mL | Freq: Once | INTRAVENOUS | Status: AC
  Administered 2016-04-02: 1000 mL via INTRAVENOUS

## 2016-04-02 MED ORDER — IBUPROFEN 800 MG PO TABS: 800.0000 mg | ORAL_TABLET | Freq: Three times a day (TID) | ORAL | Status: DC

## 2016-04-02 MED ORDER — METOCLOPRAMIDE HCL 10 MG PO TABS: 10.0000 mg | ORAL_TABLET | Freq: Four times a day (QID) | ORAL | Status: DC

## 2016-04-02 MED ORDER — METOCLOPRAMIDE HCL 5 MG/ML IJ SOLN
10.0000 mg | Freq: Once | INTRAMUSCULAR | Status: AC
  Administered 2016-04-02: 10 mg via INTRAVENOUS
  Filled 2016-04-02: qty 2

## 2016-04-02 MED ORDER — DEXAMETHASONE SODIUM PHOSPHATE 10 MG/ML IJ SOLN
10.0000 mg | Freq: Once | INTRAMUSCULAR | Status: AC
  Administered 2016-04-02: 10 mg via INTRAVENOUS
  Filled 2016-04-02: qty 1

## 2016-04-02 MED ORDER — KETOROLAC TROMETHAMINE 30 MG/ML IJ SOLN
30.0000 mg | Freq: Once | INTRAMUSCULAR | Status: AC
  Administered 2016-04-02: 30 mg via INTRAVENOUS
  Filled 2016-04-02: qty 1

## 2016-04-02 MED ORDER — DIPHENHYDRAMINE HCL 50 MG/ML IJ SOLN
25.0000 mg | Freq: Once | INTRAMUSCULAR | Status: AC
  Administered 2016-04-02: 25 mg via INTRAVENOUS
  Filled 2016-04-02: qty 1

## 2016-04-02 NOTE — ED Provider Notes (Signed)
CSN: [id number]     Arrival date & time 04/02/16  1346 History   First MD Initiated Contact with Patient 04/02/16 1551     Chief Complaint  Patient presents with  . Headache  . Emesis     HPI   Vanessa Molina is an 30 y.o. female who presents to the ED for evaluation of headache. She speaks mostly Arabic and her husband acts as Nurse, learning disability. Pt reportedly has had monthly headaches for "a long time." States this morning she started developing a gradual onset left parietal headache that has increased in severity. She states she has had associated nausea and one episode of NBNB emesis. States typically her headaches resolve with tylenol and ibuprofen. However, she is fasting for religious reasons today and as such did not take any medicine at home. She would accept medications here in the ED if indicated. She states typically her headaches can be left sided or right sided but are otherwise of the same character as her headache today, though not as severe. She and her husband are requesting a CT scan today as she has never had formal head imaging. She has not seen a neurologist for her headaches. Denies fever, chills, numbness, weakness, tingling.  Past Medical History  Diagnosis Date  . Headaches, cluster    History reviewed. No pertinent past surgical history. No family history on file. Social History  Substance Use Topics  . Smoking status: Never Smoker   . Smokeless tobacco: None  . Alcohol Use: None   OB History    No data available     Review of Systems  All other systems reviewed and are negative.     Allergies  Review of patient's allergies indicates no known allergies.  Home Medications   Prior to Admission medications   Not on File   BP 109/83 mmHg  Pulse 74  Temp(Src) 98.6 F (37 C) (Oral)  Resp 18  Ht  (1.6 m)  Wt 66.316 kg  BMI 25.90 kg/m2  SpO2 100%  LMP 03/16/2016 Physical Exam  Constitutional: She is oriented to person, place, and time.  HENT:   Right Ear: External ear normal.  Left Ear: External ear normal.  Nose: Nose normal.  Mouth/Throat: Oropharynx is clear and moist. No oropharyngeal exudate.  Eyes: Conjunctivae and EOM are normal. Pupils are equal, round, and reactive to light.  Neck: Normal range of motion. Neck supple.  Cardiovascular: Normal rate, regular rhythm, normal heart sounds and intact distal pulses.   Pulmonary/Chest: Effort normal and breath sounds normal. No respiratory distress. She has no wheezes. She exhibits no tenderness.  Abdominal: Soft. Bowel sounds are normal. She exhibits no distension. There is no tenderness. There is no rebound and no guarding.  Musculoskeletal: She exhibits no edema.  Neurological: She is alert and oriented to person, place, and time. She has normal reflexes. No cranial nerve deficit. Coordination normal.  Normal finger to nose No pronator drift 5/5 strength all extremities  Skin: Skin is warm and dry.  Psychiatric: She has a normal mood and affect.  Nursing note and vitals reviewed.   ED Course  Procedures (including critical care time) Labs Review Labs Reviewed - No data to display  Imaging Review Ct Head Wo Contrast  04/02/2016  CLINICAL DATA:  Left-sided headaches [facility] morning EXAM: CT HEAD WITHOUT CONTRAST TECHNIQUE: Contiguous axial images were obtained from the base of the skull through the vertex without intravenous contrast. COMPARISON:  None. FINDINGS: There is no evidence of mass  effect, midline shift or extra-axial fluid collections. There is no evidence of a space-occupying lesion or intracranial hemorrhage. There is no evidence of a cortical-based area of acute infarction. The ventricles and sulci are appropriate for the patient's age. The basal cisterns are patent. Visualized portions of the orbits are unremarkable. The visualized portions of the paranasal sinuses and mastoid air cells are unremarkable. The osseous structures are unremarkable. IMPRESSION: No acute  intracranial pathology. Electronically Signed   By: Elige KoHetal  Patel   On: 04/02/2016 17:57   I have personally reviewed and evaluated these images and lab results as part of my medical decision-making.   EKG Interpretation None      MDM   Final diagnoses:  Acute nonintractable headache, unspecified headache type   CT negative. Headache resolved with cocktail in the ED. Neuro exam intact with  No evidence of meningismus. Instructed f/u with neurology for further management of monthly headaches. ER return precautions given.  Carlene CoriaSerena Y Joshual Terrio, PA-C 04/03/16 1123  Lyndal Pulleyaniel Knott, MD 04/04/16 [phone]

## 2016-04-02 NOTE — Discharge Instructions (Signed)
Your CT scan today was normal. Please call to schedule a neurology follow up as soon as possible. Return to the ER for new or worsening symptoms.

## 2016-04-02 NOTE — ED Notes (Signed)
Patient here with left sided headache since am, reports vomiting with same. Spouse states she has these headaches monthly and could not take her meds this am because of her fasting

## 2016-04-04 ENCOUNTER — Encounter: Payer: Self-pay | Admitting: Obstetrics and Gynecology

## 2016-04-04 ENCOUNTER — Encounter (HOSPITAL_COMMUNITY): Payer: Self-pay

## 2016-07-08 ENCOUNTER — Encounter (HOSPITAL_COMMUNITY): Payer: Self-pay

## 2016-08-28 ENCOUNTER — Ambulatory Visit (INDEPENDENT_AMBULATORY_CARE_PROVIDER_SITE_OTHER): Payer: Self-pay | Admitting: Urgent Care

## 2016-08-28 VITALS — BP 118/72 | HR 90 | Temp 98.1°F | Resp 16 | Ht 63.0 in | Wt 153.0 lb

## 2016-08-28 DIAGNOSIS — N3090 Cystitis, unspecified without hematuria: Secondary | ICD-10-CM

## 2016-08-28 DIAGNOSIS — R319 Hematuria, unspecified: Secondary | ICD-10-CM

## 2016-08-28 DIAGNOSIS — R300 Dysuria: Secondary | ICD-10-CM

## 2016-08-28 LAB — POCT URINALYSIS DIP (MANUAL ENTRY)
Bilirubin, UA: NEGATIVE
Glucose, UA: NEGATIVE
Ketones, POC UA: NEGATIVE
NITRITE UA: NEGATIVE
Spec Grav, UA: 1.015
UROBILINOGEN UA: 0.2
pH, UA: 6.5

## 2016-08-28 LAB — POC MICROSCOPIC URINALYSIS (UMFC): MUCUS RE: ABSENT

## 2016-08-28 MED ORDER — CIPROFLOXACIN HCL 500 MG PO TABS: 500.0000 mg | ORAL_TABLET | Freq: Two times a day (BID) | ORAL | 0 refills | Status: DC

## 2016-08-28 MED ORDER — PHENAZOPYRIDINE HCL 95 MG PO TABS: 95.0000 mg | ORAL_TABLET | Freq: Three times a day (TID) | ORAL | 0 refills | Status: DC | PRN

## 2016-08-28 NOTE — Patient Instructions (Addendum)
Please call on Wednesday if you have no improvement and we will switch your antibiotic.    Urinary Tract Infection Urinary tract infections (UTIs) can develop anywhere along your urinary tract. Your urinary tract is your body's drainage system for removing wastes and extra water. Your urinary tract includes two kidneys, two ureters, a bladder, and a urethra. Your kidneys are a pair of bean-shaped organs. Each kidney is about the size of your fist. They are located below your ribs, one on each side of your spine. CAUSES Infections are caused by microbes, which are microscopic organisms, including fungi, viruses, and bacteria. These organisms are so small that they can only be seen through a microscope. Bacteria are the microbes that most commonly cause UTIs. SYMPTOMS  Symptoms of UTIs may vary by age and gender of the patient and by the location of the infection. Symptoms in young women typically include a frequent and intense urge to urinate and a painful, burning feeling in the bladder or urethra during urination. Older women and men are more likely to be tired, shaky, and weak and have muscle aches and abdominal pain. A fever may mean the infection is in your kidneys. Other symptoms of a kidney infection include pain in your back or sides below the ribs, nausea, and vomiting. DIAGNOSIS To diagnose a UTI, your caregiver will ask you about your symptoms. Your caregiver will also ask you to provide a urine sample. The urine sample will be tested for bacteria and white blood cells. White blood cells are made by your body to help fight infection. TREATMENT  Typically, UTIs can be treated with medication. Because most UTIs are caused by a bacterial infection, they usually can be treated with the use of antibiotics. The choice of antibiotic and length of treatment depend on your symptoms and the type of bacteria causing your infection. HOME CARE INSTRUCTIONS  If you were prescribed antibiotics, take them  exactly as your caregiver instructs you. Finish the medication even if you feel better after you have only taken some of the medication.  Drink enough water and fluids to keep your urine clear or pale yellow.  Avoid caffeine, tea, and carbonated beverages. They tend to irritate your bladder.  Empty your bladder often. Avoid holding urine for long periods of time.  Empty your bladder before and after sexual intercourse.  After a bowel movement, women should cleanse from front to back. Use each tissue only once. SEEK MEDICAL CARE IF:   You have back pain.  You develop a fever.  Your symptoms do not begin to resolve within 3 days. SEEK IMMEDIATE MEDICAL CARE IF:   You have severe back pain or lower abdominal pain.  You develop chills.  You have nausea or vomiting.  You have continued burning or discomfort with urination. MAKE SURE YOU:   Understand these instructions.  Will watch your condition.  Will get help right away if you are not doing well or get worse.   This information is not intended to replace advice given to you by your health care provider. Make sure you discuss any questions you have with your health care provider.   Document Released: 08/02/2005 Document Revised: 07/14/2015 Document Reviewed: 12/01/2011 Elsevier Interactive Patient Education 2016 [employer]     IF you received an x-ray today, you will receive an invoice from [facility]. Please contact [facility] at [phone] with questions or concerns regarding your invoice.   IF you received labwork today, you will receive an invoice  from [employer]. Please contact [facility] at [phone] with questions or concerns regarding your invoice.   Our billing staff will not be able to assist you with questions regarding bills from these companies.  You will be contacted with the lab results as soon as they are available. The fastest way to get your results  is to activate your My Chart account. Instructions are located on the last page of this paperwork. If you have not heard from [place] regarding the results in 2 weeks, please contact this office.

## 2016-08-28 NOTE — Progress Notes (Addendum)
Patient ID: Vanessa Molina, female   DOB: 07/26/1986, 30 y.o.   MRN: [id number]   By signing my name below, I, Essence Howell, attest that this documentation has been prepared under the direction and in the presence of Wallis BambergMario Mani, PA-C Electronically Signed: Charline BillsEssence Howell, ED Scribe 08/28/2016 at 2:58 PM.  MRN: [id number] DOB: 06/09/1986  Subjective:   Vanessa Fiscalmani Ketner is a 30 y.o. female presenting with UTI symptoms for the past 2 days. She reports a 2 day history of dysuria, hematuria and urinary frequency. Has not tried any medications for relief. Does not hydrate well. Denies abdominal pain, genital rash and genital irritation, fever, nausea, vomiting, flank pain.   No current outpatient prescriptions on file.  Patient has No Known Allergies.  Lina Sarmani  has a past medical history of Headaches, cluster and Pregnancy induced hypertension. Also  has a past surgical history that includes Cesarean section.  ObjecDysuria x2 days and hematuria tive:   Vitals: BP 118/72 (BP Location: Right Arm, Patient Position: Sitting, Cuff Size: Normal)    Pulse 90    Temp 98.1 F (36.7 C) (Oral)    Resp 16    Ht 5\' 3"  (1.6 m)    Wt 153 lb (69.4 kg)    LMP 08/01/2016 (Exact Date)    SpO2 99%    BMI 27.10 kg/m   Physical Exam  Constitutional: She is oriented to person, place, and time. She appears well-developed and well-nourished.  Cardiovascular: Normal rate, regular rhythm and intact distal pulses.  Exam reveals no gallop and no friction rub.   No murmur heard. Pulmonary/Chest: Effort normal and breath sounds normal. No respiratory distress. She has no wheezes. She has no rales.  Abdominal: Soft. Bowel sounds are normal. She exhibits no distension and no mass. There is no tenderness. There is no guarding and no CVA tenderness.  Neurological: She is alert and oriented to person, place, and time.  Skin: Skin is warm and dry.    Results for orders placed or performed in visit on 08/28/16 (from the past 24 hour(s))    POCT urinalysis dipstick     Status: Abnormal   Collection Time: 08/28/16  3:10 PM  Result Value Ref Range   Color, UA yellow yellow   Clarity, UA clear clear   Glucose, UA negative negative   Bilirubin, UA negative negative   Ketones, POC UA negative negative   Spec Grav, UA 1.015    Blood, UA large (A) negative   pH, UA 6.5    Protein Ur, POC =30 (A) negative   Urobilinogen, UA 0.2    Nitrite, UA Negative Negative   Leukocytes, UA small (1+) (A) Negative  POCT Microscopic Urinalysis ([facility])     Status: Abnormal   Collection Time: 08/28/16  3:11 PM  Result Value Ref Range   WBC,UR,HPF,POC Moderate (A) None WBC/hpf   RBC,UR,HPF,POC Moderate (A) None RBC/hpf   Bacteria None None, Too numerous to count   Mucus Absent Absent   Epithelial Cells, UR Per Microscopy Few (A) None, Too numerous to count cells/hpf    Assessment and Plan :   1. Cystitis 2. Hematuria, unspecified type 3. Dysuria - Will cover for cystitis with ciprofloxacin. Due to cost burden and no insurance, patient agreed to hold off on urine culture. Will cover empirically and patient will let me know if she has not improved by Wednesday. Consider switching to Macrobid or Septra at that point. Otherwise, advised aggressive hydration, Azo as needed for dysuria.   Marquita PalmsMario  [facility] [phone] 08/28/2016 2:58 PM

## 2016-09-07 ENCOUNTER — Encounter: Payer: Self-pay | Admitting: *Deleted

## 2016-09-07 ENCOUNTER — Ambulatory Visit (INDEPENDENT_AMBULATORY_CARE_PROVIDER_SITE_OTHER): Payer: Self-pay | Admitting: *Deleted

## 2016-09-07 DIAGNOSIS — Z3490 Encounter for supervision of normal pregnancy, unspecified, unspecified trimester: Secondary | ICD-10-CM

## 2016-09-07 DIAGNOSIS — Z3201 Encounter for pregnancy test, result positive: Secondary | ICD-10-CM

## 2016-09-07 LAB — POCT PREGNANCY, URINE: PREG TEST UR: POSITIVE — AB

## 2016-09-07 MED ORDER — PRENATAL VITAMINS 0.8 MG PO TABS: 1.0000 | ORAL_TABLET | Freq: Every day | ORAL | 12 refills | Status: DC

## 2016-09-07 NOTE — Progress Notes (Signed)
Patient presents to clinic for pregnancy test which is positive. Reviewed allergies and medications with her, recommended she start pnv. Prescription sent to pharmacy. Discussed options for pnc, pregnancy verification letter given. Understanding voiced, Arabic interpreter "Ayah" [phone] used.

## 2016-09-26 ENCOUNTER — Inpatient Hospital Stay (HOSPITAL_COMMUNITY)
Admission: AD | Admit: 2016-09-26 | Discharge: 2016-09-26 | Disposition: A | Discharge status: Home / Self Care | Payer: Medicaid Other | Source: Ambulatory Visit | Attending: Obstetrics & Gynecology | Admitting: Obstetrics & Gynecology

## 2016-09-26 ENCOUNTER — Encounter (HOSPITAL_COMMUNITY): Payer: Self-pay | Admitting: *Deleted

## 2016-09-26 DIAGNOSIS — O219 Vomiting of pregnancy, unspecified: Secondary | ICD-10-CM | POA: Diagnosis not present

## 2016-09-26 DIAGNOSIS — Z3A08 8 weeks gestation of pregnancy: Secondary | ICD-10-CM | POA: Diagnosis not present

## 2016-09-26 DIAGNOSIS — O210 Mild hyperemesis gravidarum: Secondary | ICD-10-CM | POA: Insufficient documentation

## 2016-09-26 DIAGNOSIS — R51 Headache: Secondary | ICD-10-CM | POA: Diagnosis present

## 2016-09-26 DIAGNOSIS — O26891 Other specified pregnancy related conditions, first trimester: Secondary | ICD-10-CM | POA: Diagnosis not present

## 2016-09-26 LAB — URINALYSIS, ROUTINE W REFLEX MICROSCOPIC
Bilirubin Urine: NEGATIVE
Glucose, UA: NEGATIVE mg/dL
LEUKOCYTES UA: NEGATIVE
NITRITE: NEGATIVE
PROTEIN: NEGATIVE mg/dL
Specific Gravity, Urine: 1.015 (ref 1.005–1.030)
pH: 7 (ref 5.0–8.0)

## 2016-09-26 LAB — COMPREHENSIVE METABOLIC PANEL
ALBUMIN: 3.7 g/dL (ref 3.5–5.0)
ALK PHOS: 55 U/L (ref 38–126)
ALT: 14 U/L (ref 14–54)
AST: 19 U/L (ref 15–41)
Anion gap: 8 (ref 5–15)
BUN: 7 mg/dL (ref 6–20)
CALCIUM: 8.6 mg/dL — AB (ref 8.9–10.3)
CO2: 24 mmol/L (ref 22–32)
Chloride: 103 mmol/L (ref 101–111)
Creatinine, Ser: 0.45 mg/dL (ref 0.44–1.00)
GFR calc non Af Amer: >60 mL/min (ref >60)
GLUCOSE: 96 mg/dL (ref 65–99)
Potassium: 4.2 mmol/L (ref 3.5–5.1)
SODIUM: 135 mmol/L (ref 135–145)
Total Bilirubin: 0.4 mg/dL (ref 0.3–1.2)
Total Protein: 7.4 g/dL (ref 6.5–8.1)

## 2016-09-26 LAB — CBC
HEMATOCRIT: 34.9 % — AB (ref 36.0–46.0)
HEMOGLOBIN: 11.7 g/dL — AB (ref 12.0–15.0)
MCH: 25.1 pg — AB (ref 26.0–34.0)
MCHC: 33.5 g/dL (ref 30.0–36.0)
MCV: 74.9 fL — ABNORMAL LOW (ref 78.0–100.0)
Platelets: 272 10*3/uL (ref 150–400)
RBC: 4.66 MIL/uL (ref 3.87–5.11)
RDW: 15.7 % — ABNORMAL HIGH (ref 11.5–15.5)
WBC: 5.3 10*3/uL (ref 4.0–10.5)

## 2016-09-26 LAB — URINE MICROSCOPIC-ADD ON: WBC UA: NONE SEEN WBC/hpf (ref 0–5)

## 2016-09-26 MED ORDER — PROMETHAZINE HCL 12.5 MG PO TABS: 12.5000 mg | ORAL_TABLET | Freq: Four times a day (QID) | ORAL | 0 refills | Status: DC | PRN

## 2016-09-26 MED ORDER — PROMETHAZINE HCL 25 MG/ML IJ SOLN
25.0000 mg | Freq: Once | INTRAVENOUS | Status: AC
  Administered 2016-09-26: 25 mg via INTRAVENOUS
  Filled 2016-09-26: qty 1

## 2016-09-26 NOTE — Discharge Instructions (Signed)

## 2016-09-26 NOTE — MAU Note (Signed)
Pt C/O vomiting for the last 3 weeks, denies diarrhea.  Has HA, denies abdominal pain.  No bleeding.

## 2016-09-26 NOTE — Progress Notes (Signed)
Written and verbal d/c instructions given and understanding voiced. 

## 2016-09-26 NOTE — MAU Provider Note (Signed)
History     CSN: [id number]  Arrival date and time: 09/26/16 1036   First Provider Initiated Contact with Patient 09/26/16 1127      Chief Complaint  Patient presents with  . Emesis During Pregnancy  . Headache   HPI  Vanessa Molina is a 30 y.o. [id number] at 685w0d by LMP who presents with nausea/vomiting & headache. Reports daily n/v x 3 weeks. Has vomited once today but hasn't eaten anything since yesterday. Does not have antiemetic at home. Is able to keep some food/liquids down when she eats but hasn't been eating as much d/t nausea. Also reports intermittent headaches since finding out she was pregnant. Has history of headaches but states they are more frequent since becoming pregnancy. Has been taking ibuprofen with relief. Current headache is 5/10. Nothing makes worse. Has not treated today.  Denies vaginal bleeding, abdominal pain, vaginal discharge, diarrhea, constipation, or dysuria.   OB History    Gravida Para Term Preterm AB Living   3 2 1 1 1 1    SAB TAB Ectopic Multiple Live Births   1 0 0 1 2      Past Medical History:  Diagnosis Date  . Headaches, cluster   . Pregnancy induced hypertension     Past Surgical History:  Procedure Laterality Date  . CESAREAN SECTION     x2    Family History  Problem Relation Age of Onset  . Diabetes Maternal Aunt   . Diabetes Maternal Uncle   . Kidney disease Paternal Aunt   . Diabetes Paternal Aunt   . Kidney disease Paternal Uncle   . Diabetes Paternal Uncle     Social History  Substance Use Topics  . Smoking status: Never Smoker  . Smokeless tobacco: Never Used  . Alcohol use No    Allergies: No Known Allergies  Prescriptions Prior to Admission  Medication Sig Dispense Refill Last Dose  . Prenatal Multivit-Min-Fe-FA (PRENATAL VITAMINS) 0.8 MG tablet Take 1 tablet by mouth daily. 30 tablet 12 09/25/2016 at Unknown time  . ciprofloxacin (CIPRO) 500 MG tablet Take 1 tablet (500 mg total) by mouth 2 (two) times  daily. (Patient not taking: Reported on 09/07/2016) 14 tablet 0 Not Taking  . phenazopyridine (AZO-TABS) 95 MG tablet Take 1 tablet (95 mg total) by mouth 3 (three) times daily as needed for pain. (Patient not taking: Reported on 09/07/2016) 15 tablet 0 Not Taking    Review of Systems  Constitutional: Negative for chills and fever.  Eyes: Negative for photophobia.  Gastrointestinal: Positive for nausea and vomiting. Negative for abdominal pain, constipation, diarrhea and heartburn.  Genitourinary: Negative.   Neurological: Positive for headaches.   Physical Exam   Blood pressure 108/69, pulse 83, temperature 98.3 F (36.8 C), temperature source Oral, resp. rate 16, height 5' 3.25" (1.607 m), weight 150 lb (68 kg), last menstrual period 08/01/2016.  Physical Exam  Nursing note and vitals reviewed. Constitutional: She is oriented to person, place, and time. She appears well-developed and well-nourished. No distress.  HENT:  Head: Normocephalic and atraumatic.  Eyes: Conjunctivae are normal. Right eye exhibits no discharge. Left eye exhibits no discharge. No scleral icterus.  Neck: Normal range of motion.  Cardiovascular: Normal rate, regular rhythm and normal heart sounds.   No murmur heard. Respiratory: Effort normal and breath sounds normal. No respiratory distress. She has no wheezes.  GI: Soft. Bowel sounds are normal. She exhibits no distension. There is no tenderness. There is no rebound and no guarding.  Neurological: She is alert and oriented to person, place, and time.  Skin: Skin is warm and dry. She is not diaphoretic.  Psychiatric: She has a normal mood and affect. Her behavior is normal. Judgment and thought content normal.    MAU Course  Procedures Results for orders placed or performed during the hospital encounter of 09/26/16 (from the past 24 hour(s))  Urinalysis, Routine w reflex microscopic (not at [facility])     Status: Abnormal   Collection Time: 09/26/16 11:23 AM   Result Value Ref Range   Color, Urine YELLOW YELLOW   APPearance CLEAR CLEAR   Specific Gravity, Urine 1.015 1.005 - 1.030   pH 7.0 5.0 - 8.0   Glucose, UA NEGATIVE NEGATIVE mg/dL   Hgb urine dipstick TRACE (A) NEGATIVE   Bilirubin Urine NEGATIVE NEGATIVE   Ketones, ur >80 (A) NEGATIVE mg/dL   Protein, ur NEGATIVE NEGATIVE mg/dL   Nitrite NEGATIVE NEGATIVE   Leukocytes, UA NEGATIVE NEGATIVE  Urine microscopic-add on     Status: Abnormal   Collection Time: 09/26/16 11:23 AM  Result Value Ref Range   Squamous Epithelial / LPF 0-5 (A) NONE SEEN   WBC, UA NONE SEEN 0 - 5 WBC/hpf   RBC / HPF 0-5 0 - 5 RBC/hpf   Bacteria, UA FEW (A) NONE SEEN   Urine-Other MUCOUS PRESENT   CBC     Status: Abnormal   Collection Time: 09/26/16 12:34 PM  Result Value Ref Range   WBC 5.3 4.0 - 10.5 K/uL   RBC 4.66 3.87 - 5.11 MIL/uL   Hemoglobin 11.7 (L) 12.0 - 15.0 g/dL   HCT 40.9 (L) 81.1 - 91.4 %   MCV 74.9 (L) 78.0 - 100.0 fL   MCH 25.1 (L) 26.0 - 34.0 pg   MCHC 33.5 30.0 - 36.0 g/dL   RDW 78.2 (H) 95.6 - 21.3 %   Platelets 272 150 - 400 K/uL  Comprehensive metabolic panel     Status: Abnormal   Collection Time: 09/26/16 12:34 PM  Result Value Ref Range   Sodium 135 135 - 145 mmol/L   Potassium 4.2 3.5 - 5.1 mmol/L   Chloride 103 101 - 111 mmol/L   CO2 24 22 - 32 mmol/L   Glucose, Bld 96 65 - 99 mg/dL   BUN 7 6 - 20 mg/dL   Creatinine, Ser 0.86 0.44 - 1.00 mg/dL   Calcium 8.6 (L) 8.9 - 10.3 mg/dL   Total Protein 7.4 6.5 - 8.1 g/dL   Albumin 3.7 3.5 - 5.0 g/dL   AST 19 15 - 41 U/L   ALT 14 14 - 54 U/L   Alkaline Phosphatase 55 38 - 126 U/L   Total Bilirubin 0.4 0.3 - 1.2 mg/dL   GFR calc non Af Amer >60 >60 mL/min   GFR calc Af Amer >60 >60 mL/min   Anion gap 8 5 - 15    MDM U/a shows evidence of dehydration Phenergan 25 mg in bag of D5LR IV CBC, CMP Pt reports improvement in headache & nausea after fluids & meds Not observed vomiting while in MAU  Assessment and Plan  A: 1.  Nausea and vomiting during pregnancy   2. Pregnancy headache in first trimester    P: Discharge home Rx phenergan Take tylenol prn headache Discussed reasons to return to MAU Start prenatal care  Judeth Horn 09/26/2016, 11:39 AM

## 2016-10-11 DIAGNOSIS — O0990 Supervision of high risk pregnancy, unspecified, unspecified trimester: Secondary | ICD-10-CM | POA: Insufficient documentation

## 2016-10-12 ENCOUNTER — Ambulatory Visit (INDEPENDENT_AMBULATORY_CARE_PROVIDER_SITE_OTHER): Payer: Medicaid Other | Admitting: Certified Nurse Midwife

## 2016-10-12 ENCOUNTER — Encounter: Payer: Self-pay | Admitting: Certified Nurse Midwife

## 2016-10-12 ENCOUNTER — Other Ambulatory Visit (HOSPITAL_COMMUNITY)
Admission: RE | Admit: 2016-10-12 | Discharge: 2016-10-12 | Disposition: A | Discharge status: Home / Self Care | Payer: Medicaid Other | Source: Ambulatory Visit | Attending: Certified Nurse Midwife | Admitting: Certified Nurse Midwife

## 2016-10-12 VITALS — BP 124/77 | HR 81 | Wt 148.0 lb

## 2016-10-12 DIAGNOSIS — Z1151 Encounter for screening for human papillomavirus (HPV): Secondary | ICD-10-CM | POA: Insufficient documentation

## 2016-10-12 DIAGNOSIS — Z8759 Personal history of other complications of pregnancy, childbirth and the puerperium: Secondary | ICD-10-CM | POA: Insufficient documentation

## 2016-10-12 DIAGNOSIS — N90810 Female genital mutilation status, unspecified: Secondary | ICD-10-CM

## 2016-10-12 DIAGNOSIS — Z01419 Encounter for gynecological examination (general) (routine) without abnormal findings: Secondary | ICD-10-CM | POA: Diagnosis present

## 2016-10-12 DIAGNOSIS — O0991 Supervision of high risk pregnancy, unspecified, first trimester: Secondary | ICD-10-CM

## 2016-10-12 DIAGNOSIS — O219 Vomiting of pregnancy, unspecified: Secondary | ICD-10-CM | POA: Diagnosis not present

## 2016-10-12 DIAGNOSIS — Z3490 Encounter for supervision of normal pregnancy, unspecified, unspecified trimester: Secondary | ICD-10-CM

## 2016-10-12 DIAGNOSIS — O09899 Supervision of other high risk pregnancies, unspecified trimester: Secondary | ICD-10-CM

## 2016-10-12 DIAGNOSIS — Z789 Other specified health status: Secondary | ICD-10-CM

## 2016-10-12 DIAGNOSIS — Z3491 Encounter for supervision of normal pregnancy, unspecified, first trimester: Secondary | ICD-10-CM | POA: Diagnosis not present

## 2016-10-12 DIAGNOSIS — O34219 Maternal care for unspecified type scar from previous cesarean delivery: Secondary | ICD-10-CM | POA: Diagnosis not present

## 2016-10-12 DIAGNOSIS — O0990 Supervision of high risk pregnancy, unspecified, unspecified trimester: Secondary | ICD-10-CM

## 2016-10-12 LAB — POCT URINALYSIS DIPSTICK
Bilirubin, UA: NEGATIVE
GLUCOSE UA: NEGATIVE
Ketones, UA: NEGATIVE
LEUKOCYTES UA: NEGATIVE
NITRITE UA: NEGATIVE
Protein, UA: NEGATIVE
RBC UA: NEGATIVE
Spec Grav, UA: <=1.005
Urobilinogen, UA: NEGATIVE
pH, UA: 6.5

## 2016-10-12 MED ORDER — PRENATAL VITAMINS 0.8 MG PO TABS: 1.0000 | ORAL_TABLET | Freq: Every day | ORAL | 12 refills | Status: DC

## 2016-10-12 MED ORDER — DOXYLAMINE-PYRIDOXINE 10-10 MG PO TBEC: DELAYED_RELEASE_TABLET | ORAL | 4 refills | Status: DC

## 2016-10-12 NOTE — Progress Notes (Signed)
Subjective:    Vanessa Molina is being seen today for her first obstetrical visit.  This is a planned pregnancy. She is at 6375w2d gestation. Her obstetrical history is significant for pregnancy induced hypertension, pre-eclampsia and Preterm delivery with demise around 33 weeks in [place], non english speaking. Relationship with FOB: spouse, living together. Patient does intend to breast feed. Pregnancy history fully reviewed.  Here for exam with interpreter.    The information documented in the HPI was reviewed and verified.  Menstrual History: OB History    Gravida Para Term Preterm AB Living   4 2 1 1 1 1    SAB TAB Ectopic Multiple Live Births   1 0 0 0 2      Patient's last menstrual period was 08/01/2016 (exact date).    Past Medical History:  Diagnosis Date  . Headaches, cluster   . Pregnancy induced hypertension     Past Surgical History:  Procedure Laterality Date  . CESAREAN SECTION     x2     (Not in a hospital admission) No Known Allergies  Social History  Substance Use Topics  . Smoking status: Never Smoker  . Smokeless tobacco: Never Used  . Alcohol use No    Family History  Problem Relation Age of Onset  . Diabetes Maternal Aunt   . Diabetes Maternal Uncle   . Kidney disease Paternal Aunt   . Diabetes Paternal Aunt   . Kidney disease Paternal Uncle   . Diabetes Paternal Uncle      Review of Systems Constitutional: negative for weight loss Gastrointestinal: negative for vomiting Genitourinary:negative for genital lesions and vaginal discharge and dysuria Musculoskeletal:negative for back pain Behavioral/Psych: negative for abusive relationship, depression, illegal drug usage and tobacco use    Objective:    BP 124/77   Pulse 81   Wt 148 lb (67.1 kg)   LMP 08/01/2016 (Exact Date)   BMI 26.01 kg/m  General Appearance:    Alert, cooperative, no distress, appears stated age  Head:    Normocephalic, without obvious abnormality, atraumatic  Eyes:     PERRL, conjunctiva/corneas clear, EOM's intact, fundi    benign, both eyes  Ears:    Normal TM's and external ear canals, both ears  Nose:   Nares normal, septum midline, mucosa normal, no drainage    or sinus tenderness  Throat:   Lips, mucosa, and tongue normal; teeth and gums normal  Neck:   Supple, symmetrical, trachea midline, no adenopathy;    thyroid:  no enlargement/tenderness/nodules; no carotid   bruit or JVD  Back:     Symmetric, no curvature, ROM normal, no CVA tenderness  Lungs:     Clear to auscultation bilaterally, respirations unlabored  Chest Wall:    No tenderness or deformity   Heart:    Regular rate and rhythm, S1 and S2 normal, no murmur, rub   or gallop  Breast Exam:    No tenderness, masses, or nipple abnormality  Abdomen:     Soft, non-tender, bowel sounds active all four quadrants,    no masses, no organomegaly  Genitalia:    Normal female without lesion, discharge or tenderness  Extremities:   Extremities normal, atraumatic, no cyanosis or edema  Pulses:   2+ and symmetric all extremities  Skin:   Skin color, texture, turgor normal, no rashes or lesions  Lymph nodes:   Cervical, supraclavicular, and axillary nodes normal  Neurologic:   CNII-XII intact, normal strength, sensation and reflexes    throughout  Cervix: long, thick, closed and posterior.  FHR: 145 by doppler. FH: less than U about 10 week size.      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  Counseling provided regarding continued use of seat belts, cessation of alcohol consumption, smoking or use of illicit drugs;  infection precautions i.e., influenza/TDAP immunizations, toxoplasmosis,CMV, parvovirus, listeria and varicella; workplace safety, exercise during pregnancy; routine dental care, safe medications, sexual activity, hot tubs, saunas, pools, travel, caffeine use, fish and methlymercury, potential toxins, hair treatments, varicose veins Weight gain recommendations per IOM guidelines reviewed: underweight/BMI< 18.5--> gain 28 - 40 lbs; normal weight/BMI 18.5 - 24.9--> gain 25 - 35 lbs; overweight/BMI 25 - 29.9--> gain 15 - 25 lbs; obese/BMI >30->gain  11 - 20 lbs Problem list reviewed and updated. FIRST/CF mutation testing/NIPT/QUAD SCREEN/fragile X/Ashkenazi Jewish population testing/Spinal muscular atrophy discussed: ordered. Role of ultrasound in pregnancy discussed; fetal survey: requested. Amniocentesis discussed: not indicated. VBAC calculator score: VBAC consent form provided No orders of the defined types were placed in this encounter.  No orders of the defined types were placed in this encounter.   Follow up in 4 weeks. 50% of 45 min visit spent on counseling and coordination of care.

## 2016-10-12 NOTE — Addendum Note (Signed)
Addended by: Samantha CrimesENNEY, RACHELLE ANNE on: 10/12/2016 11:18 AM   Modules accepted: Orders

## 2016-10-12 NOTE — Addendum Note (Signed)
Addended by: Marya LandryFOSTER, Mckaylah Bettendorf D on: 10/12/2016 01:32 PM   Modules accepted: Orders

## 2016-10-14 LAB — URINE CULTURE, OB REFLEX: ORGANISM ID, BACTERIA: NO GROWTH

## 2016-10-14 LAB — CULTURE, OB URINE

## 2016-10-17 LAB — NUSWAB VG+, CANDIDA 6SP
CANDIDA ALBICANS, NAA: NEGATIVE
CANDIDA PARAPSILOSIS, NAA: NEGATIVE
CHLAMYDIA TRACHOMATIS, NAA: NEGATIVE
Candida glabrata, NAA: NEGATIVE
Candida krusei, NAA: NEGATIVE
Candida lusitaniae, NAA: NEGATIVE
Candida tropicalis, NAA: NEGATIVE
Neisseria gonorrhoeae, NAA: NEGATIVE
Trich vag by NAA: NEGATIVE

## 2016-10-17 LAB — CYTOLOGY - PAP
DIAGNOSIS: NEGATIVE
HPV (WINDOPATH): NOT DETECTED

## 2016-10-18 ENCOUNTER — Ambulatory Visit (HOSPITAL_COMMUNITY)
Admission: RE | Admit: 2016-10-18 | Discharge: 2016-10-18 | Disposition: A | Discharge status: Home / Self Care | Payer: Medicaid Other | Source: Ambulatory Visit | Attending: Certified Nurse Midwife | Admitting: Certified Nurse Midwife

## 2016-10-18 ENCOUNTER — Ambulatory Visit (HOSPITAL_COMMUNITY): Payer: Medicaid Other

## 2016-10-18 DIAGNOSIS — Z3A11 11 weeks gestation of pregnancy: Secondary | ICD-10-CM | POA: Diagnosis not present

## 2016-10-18 DIAGNOSIS — O0990 Supervision of high risk pregnancy, unspecified, unspecified trimester: Secondary | ICD-10-CM

## 2016-10-18 DIAGNOSIS — Z3689 Encounter for other specified antenatal screening: Secondary | ICD-10-CM | POA: Diagnosis present

## 2016-10-19 ENCOUNTER — Other Ambulatory Visit (HOSPITAL_COMMUNITY): Payer: Self-pay | Admitting: *Deleted

## 2016-10-19 ENCOUNTER — Encounter (HOSPITAL_COMMUNITY): Payer: Self-pay

## 2016-10-19 ENCOUNTER — Ambulatory Visit (HOSPITAL_COMMUNITY)
Admission: RE | Admit: 2016-10-19 | Discharge: 2016-10-19 | Disposition: A | Discharge status: Home / Self Care | Payer: Medicaid Other | Source: Ambulatory Visit | Attending: Certified Nurse Midwife | Admitting: Certified Nurse Midwife

## 2016-10-19 VITALS — BP 103/62 | HR 88 | Wt 149.6 lb

## 2016-10-19 DIAGNOSIS — O34219 Maternal care for unspecified type scar from previous cesarean delivery: Secondary | ICD-10-CM | POA: Diagnosis not present

## 2016-10-19 DIAGNOSIS — O09291 Supervision of pregnancy with other poor reproductive or obstetric history, first trimester: Secondary | ICD-10-CM | POA: Diagnosis not present

## 2016-10-19 DIAGNOSIS — O09899 Supervision of other high risk pregnancies, unspecified trimester: Secondary | ICD-10-CM

## 2016-10-19 DIAGNOSIS — Z3A11 11 weeks gestation of pregnancy: Secondary | ICD-10-CM | POA: Diagnosis not present

## 2016-10-19 DIAGNOSIS — Z8759 Personal history of other complications of pregnancy, childbirth and the puerperium: Secondary | ICD-10-CM

## 2016-10-19 DIAGNOSIS — Z79899 Other long term (current) drug therapy: Secondary | ICD-10-CM | POA: Diagnosis not present

## 2016-10-19 LAB — TOXASSURE SELECT 13 (MW), URINE

## 2016-10-19 NOTE — Progress Notes (Signed)
Maternal Fetal Medicine Consultation  Requesting Provider(s): Denney   Primary Ob: Femina Reason for consultation: History of PIH with IUGR and neonatal death  HPI:The patient is a 30yo P1111 with EDC 05/08/2017 at 11+3 weeks referred due to a poor perinatal outcome in her first pregnancy. She had preeclampsia and deliverd a 1000g infant at 33 weeks that died of infection in the neonatal period. She delivered by C/S. Her subsequent pregnancy went to term, and delivered by C/S without complications. She was on low-dose aspirin for that pregnancy. She states she has been told to begin aspirin for this pregnancy at 13 weeks. She had a dating US yesterday that confirmed her EDC. She is currently asymptomatic.  OB History: OB History    Gravida Para Term Preterm AB Living   4 2 1 1 1 1    SAB TAB Ectopic Multiple Live Births   1 0 0 0 2      PMH:  Past Medical History:  Diagnosis Date  . Headaches, cluster   . Pregnancy induced hypertension     PSH:  Past Surgical History:  Procedure Laterality Date  . CESAREAN SECTION     x2   Meds: Diclegis, PNV, promethazine Allergies: NKDA Fh: See EPIC section Soc: See EPIC section  Review of Systems: no vaginal bleeding or cramping/contractions, no LOF, no current nausea/vomiting, though that has been a problem in the recent past All other systems reviewed and are negative.  Pe: See EPIC section    A/P: Previous pregnancy complicated by severe preeclampsia. I have instructed the patient to go ahead and start her 81mg  aspirin now. If it has not been done I recommend a full set of preeclamptic labs as soon as possible for baseline. That being said, recurrence is unlikely given the second pregnancy's benign course. I have asked her to come here for growth and anatomy scan in 8 weeks. It is uncertain whether routine US for growth would be helpful in this clinical situation. Alternatively period evaluation for adequate growth at 26-28 weeks and  32-34 weeks might be adequate supervision for good growth.  Thank you for the opportunity to be a part of the care of Vanessa Molina. Please contact our office if we can be of further assistance.   I spent approximately 30 minutes with this patient with over 50% of time spent in face-to-face counseling.

## 2016-10-24 ENCOUNTER — Other Ambulatory Visit: Payer: Self-pay | Admitting: Certified Nurse Midwife

## 2016-10-24 DIAGNOSIS — Z2839 Other underimmunization status: Secondary | ICD-10-CM | POA: Insufficient documentation

## 2016-10-24 DIAGNOSIS — Z283 Underimmunization status: Principal | ICD-10-CM

## 2016-10-24 DIAGNOSIS — O0990 Supervision of high risk pregnancy, unspecified, unspecified trimester: Secondary | ICD-10-CM

## 2016-10-24 DIAGNOSIS — O09899 Supervision of other high risk pregnancies, unspecified trimester: Secondary | ICD-10-CM

## 2016-10-24 LAB — OBSTETRIC PANEL, INCLUDING HIV
ANTIBODY SCREEN: NEGATIVE
BASOS: 0 %
Basophils Absolute: 0 10*3/uL (ref 0.0–0.2)
EOS (ABSOLUTE): 0.1 10*3/uL (ref 0.0–0.4)
EOS: 1 %
HEMATOCRIT: 36.8 % (ref 34.0–46.6)
HEMOGLOBIN: 12.1 g/dL (ref 11.1–15.9)
HIV Screen 4th Generation wRfx: NONREACTIVE
Hepatitis B Surface Ag: NEGATIVE
IMMATURE GRANS (ABS): 0 10*3/uL (ref 0.0–0.1)
Immature Granulocytes: 0 %
LYMPHS: 37 %
Lymphocytes Absolute: 2.3 10*3/uL (ref 0.7–3.1)
MCH: 25.3 pg — ABNORMAL LOW (ref 26.6–33.0)
MCHC: 32.9 g/dL (ref 31.5–35.7)
MCV: 77 fL — AB (ref 79–97)
MONOCYTES: 11 %
Monocytes Absolute: 0.7 10*3/uL (ref 0.1–0.9)
Neutrophils Absolute: 3.1 10*3/uL (ref 1.4–7.0)
Neutrophils: 51 %
Platelets: 289 10*3/uL (ref 150–379)
RBC: 4.78 x10E6/uL (ref 3.77–5.28)
RDW: 16.5 % — ABNORMAL HIGH (ref 12.3–15.4)
RH TYPE: POSITIVE
RPR Ser Ql: NONREACTIVE
RUBELLA: 16.3 {index} (ref >0.99)
WBC: 6.1 10*3/uL (ref 3.4–10.8)

## 2016-10-24 LAB — MATERNIT21 PLUS CORE+SCA
CHROMOSOME 13: NEGATIVE
CHROMOSOME 18: NEGATIVE
CHROMOSOME 21: NEGATIVE
PDF: 0
Y CHROMOSOME: NOT DETECTED

## 2016-10-24 LAB — HEMOGLOBINOPATHY EVALUATION
HEMOGLOBIN A2 QUANTITATION: 2.8 % (ref 0.7–3.1)
HEMOGLOBIN F QUANTITATION: 0 % (ref 0.0–2.0)
HGB A: 97.2 % (ref 94.0–98.0)
HGB C: 0 %
HGB S: 0 %

## 2016-10-24 LAB — TSH: TSH: 0.14 u[IU]/mL — ABNORMAL LOW (ref 0.450–4.500)

## 2016-10-24 LAB — HEMOGLOBIN A1C
Est. average glucose Bld gHb Est-mCnc: 108 mg/dL
Hgb A1c MFr Bld: 5.4 % (ref 4.8–5.6)

## 2016-10-24 LAB — VARICELLA ZOSTER ANTIBODY, IGG: Varicella zoster IgG: <135 index — ABNORMAL LOW (ref >165)

## 2016-10-24 LAB — CYSTIC FIBROSIS MUTATION 97: GENE DIS ANAL CARRIER INTERP BLD/T-IMP: NOT DETECTED

## 2016-10-24 LAB — VITAMIN D 25 HYDROXY (VIT D DEFICIENCY, FRACTURES): Vit D, 25-Hydroxy: 32.5 ng/mL (ref 30.0–100.0)

## 2016-11-09 ENCOUNTER — Ambulatory Visit (INDEPENDENT_AMBULATORY_CARE_PROVIDER_SITE_OTHER): Payer: Medicaid Other | Admitting: Certified Nurse Midwife

## 2016-11-09 VITALS — BP 104/71 | HR 88 | Temp 98.1°F | Wt 156.6 lb

## 2016-11-09 DIAGNOSIS — N90810 Female genital mutilation status, unspecified: Secondary | ICD-10-CM | POA: Diagnosis not present

## 2016-11-09 DIAGNOSIS — O09292 Supervision of pregnancy with other poor reproductive or obstetric history, second trimester: Secondary | ICD-10-CM

## 2016-11-09 DIAGNOSIS — O34219 Maternal care for unspecified type scar from previous cesarean delivery: Secondary | ICD-10-CM | POA: Diagnosis not present

## 2016-11-09 DIAGNOSIS — Z283 Underimmunization status: Secondary | ICD-10-CM

## 2016-11-09 DIAGNOSIS — O0990 Supervision of high risk pregnancy, unspecified, unspecified trimester: Secondary | ICD-10-CM

## 2016-11-09 DIAGNOSIS — Z603 Acculturation difficulty: Secondary | ICD-10-CM

## 2016-11-09 DIAGNOSIS — O09899 Supervision of other high risk pregnancies, unspecified trimester: Secondary | ICD-10-CM

## 2016-11-09 DIAGNOSIS — Z3490 Encounter for supervision of normal pregnancy, unspecified, unspecified trimester: Secondary | ICD-10-CM

## 2016-11-09 DIAGNOSIS — Z789 Other specified health status: Secondary | ICD-10-CM | POA: Diagnosis not present

## 2016-11-09 DIAGNOSIS — O09299 Supervision of pregnancy with other poor reproductive or obstetric history, unspecified trimester: Secondary | ICD-10-CM

## 2016-11-09 NOTE — Progress Notes (Signed)
  Subjective:    Vanessa Molina is a 31 y.o. female being seen today for her obstetrical visit. She is at 6338w2d gestation. Patient reports: headache, no bleeding, no contractions, no cramping, no leaking and HA relieved with OTC Tylenol.  Is taking OTC baby ASA.   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   Supervision of high risk pregnancy, antepartum - Primary   Relevant Orders   Thyroid Panel With TSH   AMB referral to maternal fetal medicine   US MFM OB DETAIL +14 WK   Delivery with history of cesarean section   Relevant Orders   Thyroid Panel With TSH   AMB referral to maternal fetal medicine   US MFM OB DETAIL +14 WK   Prior pregnancy complicated by PIH, antepartum   Relevant Orders   Thyroid Panel With TSH   AMB referral to maternal fetal medicine   US MFM OB DETAIL +14 WK   Language barrier   Maternal varicella, non-immune   History of intrauterine fetal death, currently pregnant     Patient Active Problem List   Diagnosis Date Noted  . History of intrauterine fetal death, currently pregnant 11/09/2016  . Maternal varicella, non-immune 10/24/2016  . Delivery with history of cesarean section 10/12/2016  . [facility], antepartum 10/12/2016  . Language barrier 10/12/2016  . Supervision of high risk pregnancy, antepartum 10/11/2016  . 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 Labs reviewed.  Follow up in 4 weeks. FIRST/CF mutation testing/NIPT/QUAD SCREEN/fragile X/Ashkenazi Jewish population testing/Spinal muscular atrophy discussed: results reviewed. Role of ultrasound in pregnancy discussed; fetal survey:  ordered. Amniocentesis discussed: not indicated. 50% of 15 minute visit spent on counseling and coordination of care.

## 2016-12-11 ENCOUNTER — Encounter: Payer: Medicaid Other | Admitting: Certified Nurse Midwife

## 2016-12-12 ENCOUNTER — Other Ambulatory Visit (HOSPITAL_COMMUNITY): Payer: Self-pay | Admitting: *Deleted

## 2016-12-12 ENCOUNTER — Ambulatory Visit (HOSPITAL_COMMUNITY)
Admission: RE | Admit: 2016-12-12 | Discharge: 2016-12-12 | Disposition: A | Discharge status: Home / Self Care | Payer: Medicaid Other | Source: Ambulatory Visit | Attending: Certified Nurse Midwife | Admitting: Certified Nurse Midwife

## 2016-12-12 ENCOUNTER — Other Ambulatory Visit (HOSPITAL_COMMUNITY): Payer: Self-pay | Admitting: Obstetrics and Gynecology

## 2016-12-12 ENCOUNTER — Encounter (HOSPITAL_COMMUNITY): Payer: Self-pay

## 2016-12-12 ENCOUNTER — Ambulatory Visit (INDEPENDENT_AMBULATORY_CARE_PROVIDER_SITE_OTHER): Payer: Medicaid Other | Admitting: Certified Nurse Midwife

## 2016-12-12 ENCOUNTER — Ambulatory Visit (HOSPITAL_COMMUNITY): Admission: RE | Admit: 2016-12-12 | Payer: Medicaid Other | Source: Ambulatory Visit

## 2016-12-12 VITALS — BP 109/71 | HR 87 | Wt 153.0 lb

## 2016-12-12 DIAGNOSIS — O09292 Supervision of pregnancy with other poor reproductive or obstetric history, second trimester: Secondary | ICD-10-CM

## 2016-12-12 DIAGNOSIS — K219 Gastro-esophageal reflux disease without esophagitis: Secondary | ICD-10-CM

## 2016-12-12 DIAGNOSIS — O219 Vomiting of pregnancy, unspecified: Secondary | ICD-10-CM | POA: Diagnosis not present

## 2016-12-12 DIAGNOSIS — O34219 Maternal care for unspecified type scar from previous cesarean delivery: Secondary | ICD-10-CM

## 2016-12-12 DIAGNOSIS — Z3A19 19 weeks gestation of pregnancy: Secondary | ICD-10-CM

## 2016-12-12 DIAGNOSIS — O34211 Maternal care for low transverse scar from previous cesarean delivery: Secondary | ICD-10-CM | POA: Insufficient documentation

## 2016-12-12 DIAGNOSIS — Z8759 Personal history of other complications of pregnancy, childbirth and the puerperium: Secondary | ICD-10-CM

## 2016-12-12 DIAGNOSIS — O99612 Diseases of the digestive system complicating pregnancy, second trimester: Secondary | ICD-10-CM

## 2016-12-12 DIAGNOSIS — O132 Gestational [pregnancy-induced] hypertension without significant proteinuria, second trimester: Secondary | ICD-10-CM

## 2016-12-12 DIAGNOSIS — Z789 Other specified health status: Secondary | ICD-10-CM | POA: Diagnosis not present

## 2016-12-12 DIAGNOSIS — Z758 Other problems related to medical facilities and other health care: Secondary | ICD-10-CM

## 2016-12-12 DIAGNOSIS — O09299 Supervision of pregnancy with other poor reproductive or obstetric history, unspecified trimester: Secondary | ICD-10-CM

## 2016-12-12 DIAGNOSIS — Z283 Underimmunization status: Secondary | ICD-10-CM

## 2016-12-12 DIAGNOSIS — O09899 Supervision of other high risk pregnancies, unspecified trimester: Secondary | ICD-10-CM

## 2016-12-12 DIAGNOSIS — O0990 Supervision of high risk pregnancy, unspecified, unspecified trimester: Secondary | ICD-10-CM

## 2016-12-12 DIAGNOSIS — N90810 Female genital mutilation status, unspecified: Secondary | ICD-10-CM | POA: Diagnosis not present

## 2016-12-12 DIAGNOSIS — Z363 Encounter for antenatal screening for malformations: Secondary | ICD-10-CM | POA: Diagnosis not present

## 2016-12-12 DIAGNOSIS — Z2839 Other underimmunization status: Secondary | ICD-10-CM

## 2016-12-12 MED ORDER — OMEPRAZOLE 20 MG PO CPDR: 20.0000 mg | DELAYED_RELEASE_CAPSULE | Freq: Two times a day (BID) | ORAL | 5 refills | Status: DC

## 2016-12-12 MED ORDER — ONDANSETRON HCL 8 MG PO TABS: 8.0000 mg | ORAL_TABLET | Freq: Three times a day (TID) | ORAL | 2 refills | Status: DC | PRN

## 2016-12-12 NOTE — Progress Notes (Signed)
   PRENATAL VISIT NOTE  Subjective:  Vanessa Molina is a 31 y.o. [id number] at 5510w0d being seen today for ongoing prenatal care.  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  Patient reports heartburn, nausea, no bleeding, no contractions, no cramping, no leaking and vomiting.  Contractions: Not present. Vag. Bleeding: None.  Movement: Present. Denies leaking of fluid.   The following portions of the patient's history were reviewed and updated as appropriate: allergies, current medications, past family history, past medical history, past social history, past surgical history and problem list. Problem list updated.  Objective:   Vitals:   12/12/16 0904  BP: 109/71  Pulse: 87  Weight: 153 lb (69.4 kg)    Fetal Status: Fetal Heart Rate (bpm): 142 Fundal Height: 20 cm Movement: Present     General:  Alert, oriented and cooperative. Patient is in no acute distress.  Skin: Skin is warm and dry. No rash noted.   Cardiovascular: Normal heart rate noted  Respiratory: Normal respiratory effort, no problems with respiration noted  Abdomen: Soft, gravid, appropriate for gestational age. Pain/Pressure: Absent     Pelvic:  Cervical exam deferred        Extremities: Normal range of motion.     Mental Status: Normal mood and affect. Normal behavior. Normal judgment and thought content.   Assessment and Plan:  Pregnancy: [id number] at 3910w0d  1. Supervision of high risk pregnancy, antepartum  - Thyroid Profile - AFP, Serum, Open Spina Bifida  2. Delivery with history of cesarean section     Repeat C-section @39  weeks  3. 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 History  of intrauterine fetal death, currently pregnant       5. Language barrier     Here for exam with interpreter.   6. Maternal varicella, non-immune     Varicella postpartum  7. Prior pregnancy complicated by PIH, antepartum     On baby ASA  8. Nausea and vomiting during pregnancy prior to [redacted] weeks gestation     - ondansetron (ZOFRAN) 8 MG tablet; Take 1 tablet (8 mg total) by mouth every 8 (eight) hours as needed for nausea or vomiting.  Dispense: 40 tablet; Refill: 2  9. Gastroesophageal reflux in pregnancy in second trimester     OTC Tums as well as Prilosec.  Consumes spicy foods, encouraged to decrease.  - omeprazole (PRILOSEC) 20 MG capsule; Take 1 capsule (20 mg total) by mouth 2 (two) times daily before a meal.  Dispense: 60 capsule; Refill: 5  Preterm labor symptoms and general obstetric precautions including but not limited to vaginal bleeding, contractions, leaking of fluid and fetal movement were reviewed in detail with the patient. Please refer to After Visit Summary for other counseling recommendations.  [facility] (around [facility]09/2017[facility]   Roe Coombsachelle A Doneta Bayman, CNM

## 2016-12-18 ENCOUNTER — Encounter: Payer: Self-pay | Admitting: Obstetrics and Gynecology

## 2016-12-19 LAB — AFP, SERUM, OPEN SPINA BIFIDA
AFP MoM: 0.53
AFP Value: 28.3 ng/mL
GEST. AGE ON COLLECTION DATE: 19 wk
Maternal Age At EDD: 32.5 years
OSBR RISK 1 IN: 10000
Test Results:: NEGATIVE
Weight: 153 [lb_av]

## 2016-12-19 LAB — THYROID PANEL
Free Thyroxine Index: 1.5 (ref 1.2–4.9)
T3 Uptake Ratio: 15 % — ABNORMAL LOW (ref 24–39)
T4 TOTAL: 10 ug/dL (ref 4.5–12.0)

## 2016-12-20 ENCOUNTER — Other Ambulatory Visit: Payer: Self-pay | Admitting: Certified Nurse Midwife

## 2016-12-20 DIAGNOSIS — O0990 Supervision of high risk pregnancy, unspecified, unspecified trimester: Secondary | ICD-10-CM

## 2017-01-08 ENCOUNTER — Ambulatory Visit (INDEPENDENT_AMBULATORY_CARE_PROVIDER_SITE_OTHER): Payer: Medicaid Other | Admitting: Obstetrics and Gynecology

## 2017-01-08 VITALS — BP 114/76 | HR 87 | Wt 161.0 lb

## 2017-01-08 DIAGNOSIS — Z789 Other specified health status: Secondary | ICD-10-CM

## 2017-01-08 DIAGNOSIS — O34219 Maternal care for unspecified type scar from previous cesarean delivery: Secondary | ICD-10-CM | POA: Diagnosis not present

## 2017-01-08 DIAGNOSIS — Z758 Other problems related to medical facilities and other health care: Secondary | ICD-10-CM

## 2017-01-08 DIAGNOSIS — O09292 Supervision of pregnancy with other poor reproductive or obstetric history, second trimester: Secondary | ICD-10-CM | POA: Diagnosis not present

## 2017-01-08 DIAGNOSIS — Z8759 Personal history of other complications of pregnancy, childbirth and the puerperium: Secondary | ICD-10-CM

## 2017-01-08 DIAGNOSIS — O0992 Supervision of high risk pregnancy, unspecified, second trimester: Secondary | ICD-10-CM | POA: Diagnosis not present

## 2017-01-08 DIAGNOSIS — O0990 Supervision of high risk pregnancy, unspecified, unspecified trimester: Secondary | ICD-10-CM

## 2017-01-08 DIAGNOSIS — O09299 Supervision of pregnancy with other poor reproductive or obstetric history, unspecified trimester: Secondary | ICD-10-CM

## 2017-01-08 NOTE — Progress Notes (Signed)
Prenatal Visit Note Date: 01/08/2017 Clinic: [facility]  Subjective:  Vanessa Molina is a 31 y.o. [phone] at 8015w6d being seen today for ongoing prenatal care.  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  Patient reports no complaints.   Contractions: Not present. Vag. Bleeding: None.  Movement: Present. Denies leaking of fluid.   The following portions of the patient's history were reviewed and updated as appropriate: allergies, current medications, past family history, past medical history, past social history, past surgical history and problem list. Problem list updated.  Objective:   Vitals:   01/08/17 1311  BP: 114/76  Pulse: 87  Weight: 161 lb (73 kg)    Fetal Status: Fetal Heart Rate (bpm): 139 Fundal Height: 22 cm Movement: Present     General:  Alert, oriented and cooperative. Patient is in no acute distress.  Skin: Skin is warm and dry. No rash noted.   Cardiovascular: Normal heart rate noted  Respiratory: Normal respiratory effort, no problems with respiration noted  Abdomen: Soft, gravid, appropriate for gestational age. Pain/Pressure: Absent     Pelvic:  Cervical exam deferred        Extremities: Normal range of motion.  Edema: None  Mental Status: Normal mood and affect. Normal behavior. Normal judgment and thought content.   Urinalysis:      Assessment and Plan:  Pregnancy: [id number] at 1815w6d  1. Supervision of high risk pregnancy, antepartum Routine care. Baseline PC ratio today. 2hr GTT in visit after next. Can get CMP then - Protein / creatinine ratio, urine  2. History of severe pre-eclampsia Pt confirms baby ASA. Likely caused her  32-34wk PTB and subsequent NN demise likely due to NN infection. Has surveillance growth scan scheduled for early April already.   3. Delivery with history of cesarean section D/w her re: BTL nv  4. Language barrier Interpreter used  5. Pregnancy with history of neonatal death See above. MFM only recs surveillance growth scans during the pregnancy and close watch for pre-eclampsia.   Preterm labor symptoms and general obstetric precautions including but not limited to vaginal bleeding, contractions, leaking of fluid and fetal movement were reviewed in detail with the patient. Please refer to After Visit Summary for other counseling recommendations.  Return in about 3 weeks (around 01/29/2017) for rob.   [place] Bingharlie Jacquel Mccamish, MD

## 2017-01-08 NOTE — Progress Notes (Signed)
Interpreter: Hasam #[id number]

## 2017-01-09 LAB — PROTEIN / CREATININE RATIO, URINE
Creatinine, Urine: 44 mg/dL
PROTEIN UR: 8.4 mg/dL
PROTEIN/CREAT RATIO: 191 mg/g{creat} (ref 0–200)

## 2017-01-29 ENCOUNTER — Ambulatory Visit (INDEPENDENT_AMBULATORY_CARE_PROVIDER_SITE_OTHER): Payer: Medicaid Other | Admitting: Obstetrics and Gynecology

## 2017-01-29 ENCOUNTER — Encounter: Payer: Self-pay | Admitting: *Deleted

## 2017-01-29 VITALS — BP 114/75 | HR 91 | Wt 164.0 lb

## 2017-01-29 DIAGNOSIS — Z8759 Personal history of other complications of pregnancy, childbirth and the puerperium: Secondary | ICD-10-CM

## 2017-01-29 DIAGNOSIS — O34219 Maternal care for unspecified type scar from previous cesarean delivery: Secondary | ICD-10-CM | POA: Diagnosis not present

## 2017-01-29 DIAGNOSIS — O0990 Supervision of high risk pregnancy, unspecified, unspecified trimester: Secondary | ICD-10-CM

## 2017-01-29 DIAGNOSIS — Z789 Other specified health status: Secondary | ICD-10-CM | POA: Diagnosis not present

## 2017-01-29 DIAGNOSIS — Z2839 Other underimmunization status: Secondary | ICD-10-CM

## 2017-01-29 DIAGNOSIS — O09892 Supervision of other high risk pregnancies, second trimester: Secondary | ICD-10-CM

## 2017-01-29 DIAGNOSIS — Z283 Underimmunization status: Secondary | ICD-10-CM

## 2017-01-29 DIAGNOSIS — O09899 Supervision of other high risk pregnancies, unspecified trimester: Secondary | ICD-10-CM

## 2017-01-29 MED ORDER — PANTOPRAZOLE SODIUM 20 MG PO TBEC: 20.0000 mg | DELAYED_RELEASE_TABLET | Freq: Every day | ORAL | 4 refills | Status: DC

## 2017-01-29 NOTE — Progress Notes (Signed)
   PRENATAL VISIT NOTE  Subjective:  Vanessa Molina is a 31 y.o. [phone] at 4941w6d being seen today for ongoing prenatal care.  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  Patient reports no complaints.  Contractions: Not present. Vag. Bleeding: None.  Movement: Present. Denies leaking of fluid.   The following portions of the patient's history were reviewed and updated as appropriate: allergies, current medications, past family history, past medical history, past social history, past surgical history and problem list. Problem list updated.  Objective:   Vitals:   01/29/17 1359  BP: 114/75  Pulse: 91  Weight: 164 lb (74.4 kg)    Fetal Status: Fetal Heart Rate (bpm): 151 Fundal Height: 26 cm Movement: Present     General:  Alert, oriented and cooperative. Patient is in no acute distress.  Skin: Skin is warm and dry. No rash noted.   Cardiovascular: Normal heart rate noted  Respiratory: Normal respiratory effort, no problems with respiration noted  Abdomen: Soft, gravid, appropriate for gestational age. Pain/Pressure: Absent     Pelvic:  Cervical exam deferred        Extremities: Normal range of motion.  Edema: None  Mental Status: Normal mood and affect. Normal behavior. Normal judgment and thought content.   Assessment and Plan:  Pregnancy: [id number] at 5941w6d  1. History of severe pre-eclampsia Continue daily ASA  2. Supervision of high risk pregnancy, antepartum Patient is doing well without complaints Anatomy results reviewed with the patient  Follow up growth ultrasound on 4/2  3. Delivery with history of cesarean section Plan for repeat at 39 weeks. Patient plans condoms  for contraception  4. Maternal varicella, non-immune Will offer pp  5. Language barrier Video interpreter used  Preterm labor symptoms and general obstetric precautions including but not limited to vaginal bleeding, contractions, leaking of fluid and fetal movement were reviewed in detail with the patient. Please refer to After Visit Summary for other counseling recommendations.  Return in about 2 weeks (around 02/12/2017) for ROB, 2 hr glucola next visit.   Catalina AntiguaPeggy Perrin Gens, MD

## 2017-02-05 ENCOUNTER — Encounter (HOSPITAL_COMMUNITY): Payer: Self-pay

## 2017-02-05 ENCOUNTER — Ambulatory Visit (HOSPITAL_COMMUNITY)
Admission: RE | Admit: 2017-02-05 | Discharge: 2017-02-05 | Disposition: A | Discharge status: Home / Self Care | Payer: Medicaid Other | Source: Ambulatory Visit | Attending: Certified Nurse Midwife | Admitting: Certified Nurse Midwife

## 2017-02-05 ENCOUNTER — Other Ambulatory Visit (HOSPITAL_COMMUNITY): Payer: Self-pay | Admitting: *Deleted

## 2017-02-05 DIAGNOSIS — O34219 Maternal care for unspecified type scar from previous cesarean delivery: Secondary | ICD-10-CM | POA: Insufficient documentation

## 2017-02-05 DIAGNOSIS — O09292 Supervision of pregnancy with other poor reproductive or obstetric history, second trimester: Secondary | ICD-10-CM | POA: Diagnosis not present

## 2017-02-05 DIAGNOSIS — Z3A26 26 weeks gestation of pregnancy: Secondary | ICD-10-CM | POA: Insufficient documentation

## 2017-02-05 DIAGNOSIS — O09299 Supervision of pregnancy with other poor reproductive or obstetric history, unspecified trimester: Secondary | ICD-10-CM

## 2017-02-05 DIAGNOSIS — O132 Gestational [pregnancy-induced] hypertension without significant proteinuria, second trimester: Secondary | ICD-10-CM | POA: Diagnosis not present

## 2017-02-12 ENCOUNTER — Other Ambulatory Visit: Payer: Medicaid Other

## 2017-02-12 ENCOUNTER — Ambulatory Visit (INDEPENDENT_AMBULATORY_CARE_PROVIDER_SITE_OTHER): Payer: Medicaid Other | Admitting: Certified Nurse Midwife

## 2017-02-12 ENCOUNTER — Encounter: Payer: Self-pay | Admitting: *Deleted

## 2017-02-12 ENCOUNTER — Encounter (HOSPITAL_COMMUNITY): Payer: Self-pay

## 2017-02-12 VITALS — BP 109/70 | HR 93 | Wt 163.0 lb

## 2017-02-12 DIAGNOSIS — O09899 Supervision of other high risk pregnancies, unspecified trimester: Secondary | ICD-10-CM

## 2017-02-12 DIAGNOSIS — Z283 Underimmunization status: Secondary | ICD-10-CM

## 2017-02-12 DIAGNOSIS — Z8759 Personal history of other complications of pregnancy, childbirth and the puerperium: Secondary | ICD-10-CM

## 2017-02-12 DIAGNOSIS — O34219 Maternal care for unspecified type scar from previous cesarean delivery: Secondary | ICD-10-CM | POA: Diagnosis not present

## 2017-02-12 DIAGNOSIS — Z3483 Encounter for supervision of other normal pregnancy, third trimester: Secondary | ICD-10-CM

## 2017-02-12 DIAGNOSIS — O0990 Supervision of high risk pregnancy, unspecified, unspecified trimester: Secondary | ICD-10-CM

## 2017-02-12 NOTE — Progress Notes (Signed)
Pt presents for 2 hr gtt and ROB.  No concerns per pt.

## 2017-02-12 NOTE — Progress Notes (Signed)
   PRENATAL VISIT NOTE  Subjective:  Vanessa Molina is a 31 y.o. [phone] at [redacted]w[redacted]d being seen today for ongoing prenatal care.  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  Patient reports no bleeding, no contractions, no cramping, no leaking and occasional epistaxis; discussed OTC normal saline nose spray.  Contractions: Not present. Vag. Bleeding: None.  Movement: Present. Denies leaking of fluid.   The following portions of the patient's history were reviewed and updated as appropriate: allergies, current medications, past family history, past medical history, past social history, past surgical history and problem list. Problem list updated.  Objective:   Vitals:   02/12/17 1001  BP: 109/70  Pulse: 93  Weight: 163 lb (73.9 kg)    Fetal Status: Fetal Heart Rate (bpm): 141 Fundal Height: 28 cm Movement: Present     General:  Alert, oriented and cooperative. Patient is in no acute distress.  Skin: Skin is warm and dry. No rash noted.   Cardiovascular: Normal heart rate noted  Respiratory: Normal respiratory effort, no problems with respiration noted  Abdomen: Soft, gravid, appropriate for gestational age. Pain/Pressure: Absent     Pelvic:  Cervical exam deferred        Extremities: Normal range of motion.  Edema: None  Mental Status: Normal mood and affect. Normal behavior. Normal judgment and thought content.   Assessment and Plan:  Pregnancy: [id number] at [redacted]w[redacted]d  1. Supervision of high risk pregnancy, antepartum      Doing well  2. Maternal varicella, non-immune      Varicella postpartum  3. History of severe pre-eclampsia     Taking baby ASA.  Baseline PIH labs obtained.    4. Delivery with history of cesarean section     Scheduled repeat C-section.   Preterm labor symptoms and general obstetric precautions including but not limited to vaginal bleeding, contractions, leaking of fluid and fetal movement were reviewed in detail with the patient. Please refer to After Visit Summary for other counseling recommendations.  Return in about 2 weeks (around 02/26/2017) for ROB.   Roe Coombs, CNM

## 2017-02-13 LAB — CBC
HEMATOCRIT: 30.4 % — AB (ref 34.0–46.6)
Hemoglobin: 10.1 g/dL — ABNORMAL LOW (ref 11.1–15.9)
MCH: 27.4 pg (ref 26.6–33.0)
MCHC: 33.2 g/dL (ref 31.5–35.7)
MCV: 83 fL (ref 79–97)
PLATELETS: 238 10*3/uL (ref 150–379)
RBC: 3.68 x10E6/uL — ABNORMAL LOW (ref 3.77–5.28)
RDW: 14.8 % (ref 12.3–15.4)
WBC: 5.5 10*3/uL (ref 3.4–10.8)

## 2017-02-13 LAB — LACTATE DEHYDROGENASE: LDH: 179 IU/L (ref 119–226)

## 2017-02-13 LAB — PROTEIN / CREATININE RATIO, URINE
Creatinine, Urine: 110.5 mg/dL
PROTEIN UR: 11 mg/dL
PROTEIN/CREAT RATIO: 100 mg/g{creat} (ref 0–200)

## 2017-02-13 LAB — HIV ANTIBODY (ROUTINE TESTING W REFLEX): HIV Screen 4th Generation wRfx: NONREACTIVE

## 2017-02-13 LAB — GLUCOSE TOLERANCE, 2 HOURS W/ 1HR
GLUCOSE, 2 HOUR: 106 mg/dL (ref 65–152)
GLUCOSE, FASTING: 78 mg/dL (ref 65–91)
Glucose, 1 hour: 94 mg/dL (ref 65–179)

## 2017-02-13 LAB — CREATININE, SERUM
Creatinine, Ser: 0.51 mg/dL — ABNORMAL LOW (ref 0.57–1.00)
GFR calc non Af Amer: 128 mL/min/{1.73_m2} (ref >59)
GFR, EST AFRICAN AMERICAN: 147 mL/min/{1.73_m2} (ref >59)

## 2017-02-13 LAB — RPR: RPR: NONREACTIVE

## 2017-02-13 LAB — ALT: ALT: 12 IU/L (ref 0–32)

## 2017-02-13 LAB — AST: AST: 16 IU/L (ref 0–40)

## 2017-02-14 ENCOUNTER — Other Ambulatory Visit: Payer: Self-pay | Admitting: Certified Nurse Midwife

## 2017-02-14 DIAGNOSIS — O99013 Anemia complicating pregnancy, third trimester: Secondary | ICD-10-CM | POA: Insufficient documentation

## 2017-02-14 DIAGNOSIS — O0990 Supervision of high risk pregnancy, unspecified, unspecified trimester: Secondary | ICD-10-CM

## 2017-02-14 MED ORDER — CITRANATAL BLOOM 90-1 MG PO TABS: 1.0000 | ORAL_TABLET | Freq: Every day | ORAL | 12 refills | Status: DC

## 2017-02-15 ENCOUNTER — Encounter: Payer: Self-pay | Admitting: *Deleted

## 2017-02-15 ENCOUNTER — Other Ambulatory Visit: Payer: Self-pay | Admitting: Certified Nurse Midwife

## 2017-02-15 DIAGNOSIS — O99013 Anemia complicating pregnancy, third trimester: Secondary | ICD-10-CM

## 2017-02-15 LAB — PATHOLOGIST SMEAR REVIEW
Basophils Absolute: 0 10*3/uL (ref 0.0–0.2)
Basos: 0 %
EOS (ABSOLUTE): 0 10*3/uL (ref 0.0–0.4)
Eos: 1 %
Hematocrit: 30.6 % — ABNORMAL LOW (ref 34.0–46.6)
Hemoglobin: 9.9 g/dL — ABNORMAL LOW (ref 11.1–15.9)
IMMATURE GRANS (ABS): 0 10*3/uL (ref 0.0–0.1)
IMMATURE GRANULOCYTES: 1 %
LYMPHS ABS: 2.3 10*3/uL (ref 0.7–3.1)
Lymphs: 36 %
MCH: 27.3 pg (ref 26.6–33.0)
MCHC: 32.4 g/dL (ref 31.5–35.7)
MCV: 85 fL (ref 79–97)
MONOCYTES: 6 %
Monocytes Absolute: 0.4 10*3/uL (ref 0.1–0.9)
Neutrophils Absolute: 3.6 10*3/uL (ref 1.4–7.0)
Neutrophils: 56 %
PATH REV PLTS: NORMAL
PATH REV WBC: NORMAL
Platelets: 251 10*3/uL (ref 150–379)
RBC: 3.62 x10E6/uL — AB (ref 3.77–5.28)
RDW: 14.9 % (ref 12.3–15.4)
WBC: 6.4 10*3/uL (ref 3.4–10.8)

## 2017-02-26 ENCOUNTER — Encounter: Payer: Self-pay | Admitting: Certified Nurse Midwife

## 2017-02-26 ENCOUNTER — Ambulatory Visit (INDEPENDENT_AMBULATORY_CARE_PROVIDER_SITE_OTHER): Payer: Medicaid Other | Admitting: Certified Nurse Midwife

## 2017-02-26 VITALS — BP 106/69 | HR 102 | Wt 167.0 lb

## 2017-02-26 DIAGNOSIS — Z283 Underimmunization status: Secondary | ICD-10-CM

## 2017-02-26 DIAGNOSIS — O99013 Anemia complicating pregnancy, third trimester: Secondary | ICD-10-CM | POA: Diagnosis not present

## 2017-02-26 DIAGNOSIS — O09293 Supervision of pregnancy with other poor reproductive or obstetric history, third trimester: Secondary | ICD-10-CM

## 2017-02-26 DIAGNOSIS — N90810 Female genital mutilation status, unspecified: Secondary | ICD-10-CM

## 2017-02-26 DIAGNOSIS — Z789 Other specified health status: Secondary | ICD-10-CM

## 2017-02-26 DIAGNOSIS — Z8759 Personal history of other complications of pregnancy, childbirth and the puerperium: Secondary | ICD-10-CM

## 2017-02-26 DIAGNOSIS — O0993 Supervision of high risk pregnancy, unspecified, third trimester: Secondary | ICD-10-CM | POA: Diagnosis not present

## 2017-02-26 DIAGNOSIS — O09899 Supervision of other high risk pregnancies, unspecified trimester: Secondary | ICD-10-CM

## 2017-02-26 DIAGNOSIS — O0990 Supervision of high risk pregnancy, unspecified, unspecified trimester: Secondary | ICD-10-CM

## 2017-02-26 DIAGNOSIS — D649 Anemia, unspecified: Secondary | ICD-10-CM

## 2017-02-26 DIAGNOSIS — O09299 Supervision of pregnancy with other poor reproductive or obstetric history, unspecified trimester: Secondary | ICD-10-CM

## 2017-02-26 DIAGNOSIS — O34219 Maternal care for unspecified type scar from previous cesarean delivery: Secondary | ICD-10-CM | POA: Diagnosis not present

## 2017-02-26 NOTE — Progress Notes (Signed)
   PRENATAL VISIT NOTE  Subjective:  Vanessa Molina is a 31 y.o. [phone] at [redacted]w[redacted]d being seen today for ongoing prenatal care.  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  Patient reports no complaints.  Contractions: Not present. Vag. Bleeding: None.  Movement: Present. Denies leaking of fluid.   The following portions of the patient's history were reviewed and updated as appropriate: allergies, current medications, past family history, past medical history, past social history, past surgical history and problem list. Problem list updated.  Objective:   Vitals:   02/26/17 1105  BP: 106/69  Pulse: (!) 102  Weight: 167 lb (75.8 kg)    Fetal Status:     Movement: Present     General:  Alert, oriented and cooperative. Patient is in no acute distress.  Skin: Skin is warm and dry. No rash noted.   Cardiovascular: Normal heart rate noted  Respiratory: Normal respiratory effort, no problems with respiration noted  Abdomen: Soft, gravid, appropriate for gestational age. Pain/Pressure: Absent     Pelvic:  Cervical exam deferred        Extremities: Normal range of motion.  Edema: None  Mental Status: Normal mood and affect. Normal behavior. Normal judgment and thought content.   Assessment and Plan:  Pregnancy: A5W0981 at [redacted]w[redacted]d  1. 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 Supervision of high risk pregnancy, antepartum     [place] scheduled for 03/19/17  3. Delivery with history of cesarean section     Repeat C-section desired, declines BTL  4. History of severe pre-eclampsia     Taking daily ASA  5. Language barrier    Arabic video interpreter  used  6. Maternal varicella, non-immune      Varicella postpartum  7. Pregnancy with history of neonatal death     Surveillance growth scans per MFM  8. Anemia affecting pregnancy in third trimester     Taking Bloom  Preterm labor symptoms and general obstetric precautions including but not limited to vaginal bleeding, contractions, leaking of fluid and fetal movement were reviewed in detail with the patient. Please refer to After Visit Summary for other counseling recommendations.  Return in about 2 weeks (around 03/12/2017) for [facility].   Roe Coombs, CNM

## 2017-03-15 ENCOUNTER — Ambulatory Visit (INDEPENDENT_AMBULATORY_CARE_PROVIDER_SITE_OTHER): Payer: Medicaid Other | Admitting: Obstetrics and Gynecology

## 2017-03-15 VITALS — BP 105/68 | HR 103 | Wt 171.0 lb

## 2017-03-15 DIAGNOSIS — Z283 Underimmunization status: Secondary | ICD-10-CM

## 2017-03-15 DIAGNOSIS — Z8759 Personal history of other complications of pregnancy, childbirth and the puerperium: Secondary | ICD-10-CM | POA: Diagnosis not present

## 2017-03-15 DIAGNOSIS — O09293 Supervision of pregnancy with other poor reproductive or obstetric history, third trimester: Secondary | ICD-10-CM

## 2017-03-15 DIAGNOSIS — O09899 Supervision of other high risk pregnancies, unspecified trimester: Secondary | ICD-10-CM

## 2017-03-15 DIAGNOSIS — O0990 Supervision of high risk pregnancy, unspecified, unspecified trimester: Secondary | ICD-10-CM

## 2017-03-15 DIAGNOSIS — Z789 Other specified health status: Secondary | ICD-10-CM

## 2017-03-15 DIAGNOSIS — O0993 Supervision of high risk pregnancy, unspecified, third trimester: Secondary | ICD-10-CM

## 2017-03-15 DIAGNOSIS — O34219 Maternal care for unspecified type scar from previous cesarean delivery: Secondary | ICD-10-CM | POA: Diagnosis not present

## 2017-03-15 DIAGNOSIS — O09299 Supervision of pregnancy with other poor reproductive or obstetric history, unspecified trimester: Secondary | ICD-10-CM

## 2017-03-15 NOTE — Progress Notes (Signed)
   PRENATAL VISIT NOTE  Subjective:  Vanessa Molina is a 31 y.o. [phone] at 923w2d being seen today for ongoing prenatal care.  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  Patient reports no complaints.  Contractions: Not present. Vag. Bleeding: None.  Movement: Present. Denies leaking of fluid.   The following portions of the patient's history were reviewed and updated as appropriate: allergies, current medications, past family history, past medical history, past social history, past surgical history and problem list. Problem list updated.  Objective:   Vitals:   03/15/17 1606  BP: 105/68  Pulse: (!) 103  Weight: 171 lb (77.6 kg)    Fetal Status: Fetal Heart Rate (bpm): 142 Fundal Height: 32 cm Movement: Present     General:  Alert, oriented and cooperative. Patient is in no acute distress.  Skin: Skin is warm and dry. No rash noted.   Cardiovascular: Normal heart rate noted  Respiratory: Normal respiratory effort, no problems with respiration noted  Abdomen: Soft, gravid, appropriate for gestational age. Pain/Pressure: Absent     Pelvic:  Cervical exam deferred        Extremities: Normal range of motion.  Edema: None  Mental Status: Normal mood and affect. Normal behavior. Normal judgment and thought content.   Assessment and Plan:  Pregnancy: [id number] at 3923w2d  1. Supervision of high risk pregnancy, antepartum Patient is doing well without complaints  2. History of severe pre-eclampsia Continue ASA  3. Pregnancy with history of neonatal death Follow up growth ultrasound scheduled  4. Delivery with history of cesarean  section Scheduled for repeat c-section at 39 weeks  5. Maternal varicella, non-immune Will offer pp  6. Language barrier Interpreter used  Preterm labor symptoms and general obstetric precautions including but not limited to vaginal bleeding, contractions, leaking of fluid and fetal movement were reviewed in detail with the patient. Please refer to After Visit Summary for other counseling recommendations.  No Follow-up on file.   Fredi Geiler, Gigi GinPeggy, MD

## 2017-03-15 NOTE — Progress Notes (Signed)
Arabic Interpreter: Claudine [phone]#[id number]

## 2017-03-19 ENCOUNTER — Encounter (HOSPITAL_COMMUNITY): Payer: Self-pay

## 2017-03-19 ENCOUNTER — Ambulatory Visit (HOSPITAL_COMMUNITY)
Admission: RE | Admit: 2017-03-19 | Discharge: 2017-03-19 | Disposition: A | Discharge status: Home / Self Care | Payer: Medicaid Other | Source: Ambulatory Visit | Attending: Certified Nurse Midwife | Admitting: Certified Nurse Midwife

## 2017-03-19 DIAGNOSIS — Z3A32 32 weeks gestation of pregnancy: Secondary | ICD-10-CM | POA: Insufficient documentation

## 2017-03-19 DIAGNOSIS — O09299 Supervision of pregnancy with other poor reproductive or obstetric history, unspecified trimester: Secondary | ICD-10-CM | POA: Diagnosis not present

## 2017-03-19 DIAGNOSIS — O0990 Supervision of high risk pregnancy, unspecified, unspecified trimester: Secondary | ICD-10-CM

## 2017-03-29 ENCOUNTER — Ambulatory Visit (INDEPENDENT_AMBULATORY_CARE_PROVIDER_SITE_OTHER): Payer: Medicaid Other | Admitting: Obstetrics & Gynecology

## 2017-03-29 DIAGNOSIS — O09293 Supervision of pregnancy with other poor reproductive or obstetric history, third trimester: Secondary | ICD-10-CM | POA: Diagnosis not present

## 2017-03-29 DIAGNOSIS — O0990 Supervision of high risk pregnancy, unspecified, unspecified trimester: Secondary | ICD-10-CM

## 2017-03-29 DIAGNOSIS — O0993 Supervision of high risk pregnancy, unspecified, third trimester: Secondary | ICD-10-CM

## 2017-03-29 NOTE — Progress Notes (Signed)
Pt presents for ROB with no c/o today. Recent growth 2041g 55th%tile completed 03/19/17. Antepartum testing recommended until c/s.

## 2017-03-29 NOTE — Progress Notes (Signed)
   PRENATAL VISIT NOTE  Subjective:  Vanessa Molina is a 31 y.o. [id number] at 6332w2d being seen today for ongoing prenatal care.  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  Patient reports no complaints and fasting during day for Ramadan.  Contractions: Not present. Vag. Bleeding: None.  Movement: Present. Denies leaking of fluid.   The following portions of the patient's history were reviewed and updated as appropriate: allergies, current medications, past family history, past medical history, past social history, past surgical history and problem list. Problem list updated.  Objective:   Vitals:   03/29/17 1101  BP: 109/69  Pulse: 76  Weight: 166 lb 9.6 oz (75.6 kg)    Fetal Status: Fetal Heart Rate (bpm): 138 Fundal Height: 34 cm Movement: Present     General:  Alert, oriented and cooperative. Patient is in no acute distress.  Skin: Skin is warm and dry. No rash noted.   Cardiovascular: Normal heart rate noted  Respiratory: Normal respiratory effort, no problems with respiration noted  Abdomen: Soft, gravid, appropriate for gestational age. Pain/Pressure: Absent     Pelvic:  Cervical exam deferred        Extremities: Normal range of motion.  Edema: Trace  Mental Status: Normal mood and affect. Normal behavior. Normal judgment and thought content.   Assessment and Plan:  Pregnancy: [id number] at 2932w2d  1. Supervision of high risk pregnancy, antepartum Nl BP, weight loss noted attributed to daytime fast  Preterm labor symptoms and general obstetric precautions including but not limited to vaginal bleeding, contractions, leaking of  fluid and fetal movement were reviewed in detail with the patient. Please refer to After Visit Summary for other counseling recommendations.  Return in about 1 week (around 04/05/2017).   Scheryl DarterJames Estel Scholze, MD

## 2017-03-29 NOTE — Patient Instructions (Signed)

## 2017-04-04 ENCOUNTER — Other Ambulatory Visit (HOSPITAL_COMMUNITY)
Admission: RE | Admit: 2017-04-04 | Discharge: 2017-04-04 | Disposition: A | Discharge status: Home / Self Care | Payer: Medicaid Other | Source: Ambulatory Visit | Attending: Certified Nurse Midwife | Admitting: Certified Nurse Midwife

## 2017-04-04 ENCOUNTER — Ambulatory Visit (INDEPENDENT_AMBULATORY_CARE_PROVIDER_SITE_OTHER): Payer: Medicaid Other | Admitting: Certified Nurse Midwife

## 2017-04-04 VITALS — BP 96/62 | HR 97 | Wt 166.0 lb

## 2017-04-04 DIAGNOSIS — Z283 Underimmunization status: Secondary | ICD-10-CM

## 2017-04-04 DIAGNOSIS — Z8759 Personal history of other complications of pregnancy, childbirth and the puerperium: Secondary | ICD-10-CM

## 2017-04-04 DIAGNOSIS — O09299 Supervision of pregnancy with other poor reproductive or obstetric history, unspecified trimester: Secondary | ICD-10-CM

## 2017-04-04 DIAGNOSIS — O09293 Supervision of pregnancy with other poor reproductive or obstetric history, third trimester: Secondary | ICD-10-CM

## 2017-04-04 DIAGNOSIS — O0993 Supervision of high risk pregnancy, unspecified, third trimester: Secondary | ICD-10-CM | POA: Diagnosis present

## 2017-04-04 DIAGNOSIS — O0990 Supervision of high risk pregnancy, unspecified, unspecified trimester: Secondary | ICD-10-CM

## 2017-04-04 DIAGNOSIS — Z3A35 35 weeks gestation of pregnancy: Secondary | ICD-10-CM | POA: Diagnosis not present

## 2017-04-04 DIAGNOSIS — Z789 Other specified health status: Secondary | ICD-10-CM

## 2017-04-04 DIAGNOSIS — O34219 Maternal care for unspecified type scar from previous cesarean delivery: Secondary | ICD-10-CM | POA: Diagnosis not present

## 2017-04-04 DIAGNOSIS — O09899 Supervision of other high risk pregnancies, unspecified trimester: Secondary | ICD-10-CM

## 2017-04-04 NOTE — Progress Notes (Signed)
Interpreter [phone]#[id number] - used for pt work up.

## 2017-04-04 NOTE — Progress Notes (Signed)
   PRENATAL VISIT NOTE  Subjective:  Carolyne Fiscalmani Lacson is a 31 y.o. [id number] at 3237w1d being seen today for ongoing prenatal care.  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  Patient reports no complaints.  Contractions: Not present. Vag. Bleeding: None.  Movement: Present. Denies leaking of fluid.   The following portions of the patient's history were reviewed and updated as appropriate: allergies, current medications, past family history, past medical history, past social history, past surgical history and problem list. Problem list updated.  Objective:   Vitals:   04/04/17 1407  BP: 96/62  Pulse: 97  Weight: 166 lb (75.3 kg)    Fetal Status: Fetal Heart Rate (bpm): 134 Fundal Height: 35 cm Movement: Present  Presentation: Vertex  General:  Alert, oriented and cooperative. Patient is in no acute distress.  Skin: Skin is warm and dry. No rash noted.   Cardiovascular: Normal heart rate noted  Respiratory: Normal respiratory effort, no problems with respiration noted  Abdomen: Soft, gravid, appropriate for gestational age. Pain/Pressure: Absent     Pelvic:  Cervical exam performed Dilation: 1 Effacement (%): 20 Station: -3  Extremities: Normal range of motion.     Mental Status: Normal mood and affect. Normal behavior. Normal judgment and thought content.  NST: + accels, no decels, moderate variability, Cat. 1 tracing. No contractions on toco.   Assessment and Plan:  Pregnancy: [id number] at 3237w1d  1. Supervision of high risk pregnancy, antepartum      Reactive NST - Strep Gp B NAA - Cervicovaginal ancillary only  2. Delivery with  history of cesarean section     Repeat C-section scheduled for 39 weeks  3. History of severe pre-eclampsia      Taking baby ASA  4. Language barrier     Video interpreter used  5. Maternal varicella, non-immune      Varicella postpartum  6. Pregnancy with history of neonatal death     NST, antenatal testing.    Preterm labor symptoms and general obstetric precautions including but not limited to vaginal bleeding, contractions, leaking of fluid and fetal movement were reviewed in detail with the patient. Please refer to After Visit Summary for other counseling recommendations.  [facility] about 1 week (around 04/11/2017[facility], Bi-weekly Antenatal testing, Needs to see FP MD here.   Roe Coombsachelle A Jaimey Franchini, CNM

## 2017-04-05 LAB — CERVICOVAGINAL ANCILLARY ONLY
Bacterial vaginitis: NEGATIVE
Candida vaginitis: NEGATIVE
Chlamydia: NEGATIVE
Neisseria Gonorrhea: NEGATIVE
Trichomonas: NEGATIVE

## 2017-04-06 ENCOUNTER — Other Ambulatory Visit: Payer: Self-pay | Admitting: Family Medicine

## 2017-04-07 LAB — STREP GP B NAA: Strep Gp B NAA: NEGATIVE

## 2017-04-09 ENCOUNTER — Ambulatory Visit (HOSPITAL_COMMUNITY)
Admission: RE | Admit: 2017-04-09 | Discharge: 2017-04-09 | Disposition: A | Discharge status: Home / Self Care | Payer: Medicaid Other | Source: Ambulatory Visit | Attending: Certified Nurse Midwife | Admitting: Certified Nurse Midwife

## 2017-04-09 ENCOUNTER — Encounter (HOSPITAL_COMMUNITY): Payer: Self-pay

## 2017-04-09 ENCOUNTER — Inpatient Hospital Stay (HOSPITAL_COMMUNITY)
Admission: AD | Admit: 2017-04-09 | Discharge: 2017-04-09 | Disposition: A | Discharge status: Home / Self Care | Payer: Medicaid Other | Source: Ambulatory Visit | Attending: Obstetrics and Gynecology | Admitting: Obstetrics and Gynecology

## 2017-04-09 ENCOUNTER — Other Ambulatory Visit: Payer: Self-pay | Admitting: Certified Nurse Midwife

## 2017-04-09 ENCOUNTER — Ambulatory Visit (INDEPENDENT_AMBULATORY_CARE_PROVIDER_SITE_OTHER): Payer: Medicaid Other | Admitting: Certified Nurse Midwife

## 2017-04-09 VITALS — BP 97/65 | HR 85 | Wt 169.8 lb

## 2017-04-09 DIAGNOSIS — O34219 Maternal care for unspecified type scar from previous cesarean delivery: Secondary | ICD-10-CM | POA: Insufficient documentation

## 2017-04-09 DIAGNOSIS — O0993 Supervision of high risk pregnancy, unspecified, third trimester: Secondary | ICD-10-CM

## 2017-04-09 DIAGNOSIS — O0990 Supervision of high risk pregnancy, unspecified, unspecified trimester: Secondary | ICD-10-CM

## 2017-04-09 DIAGNOSIS — O09299 Supervision of pregnancy with other poor reproductive or obstetric history, unspecified trimester: Secondary | ICD-10-CM

## 2017-04-09 DIAGNOSIS — O288 Other abnormal findings on antenatal screening of mother: Secondary | ICD-10-CM | POA: Diagnosis not present

## 2017-04-09 DIAGNOSIS — Z3A35 35 weeks gestation of pregnancy: Secondary | ICD-10-CM | POA: Insufficient documentation

## 2017-04-09 DIAGNOSIS — O09293 Supervision of pregnancy with other poor reproductive or obstetric history, third trimester: Secondary | ICD-10-CM | POA: Diagnosis not present

## 2017-04-09 DIAGNOSIS — O368390 Maternal care for abnormalities of the fetal heart rate or rhythm, unspecified trimester, not applicable or unspecified: Secondary | ICD-10-CM

## 2017-04-09 DIAGNOSIS — O9989 Other specified diseases and conditions complicating pregnancy, childbirth and the puerperium: Secondary | ICD-10-CM | POA: Diagnosis not present

## 2017-04-09 DIAGNOSIS — Z8759 Personal history of other complications of pregnancy, childbirth and the puerperium: Secondary | ICD-10-CM

## 2017-04-09 DIAGNOSIS — Z789 Other specified health status: Secondary | ICD-10-CM

## 2017-04-09 LAB — URINALYSIS, ROUTINE W REFLEX MICROSCOPIC
BILIRUBIN URINE: NEGATIVE
Glucose, UA: NEGATIVE mg/dL
Ketones, ur: NEGATIVE mg/dL
Leukocytes, UA: NEGATIVE
NITRITE: NEGATIVE
PROTEIN: NEGATIVE mg/dL
SPECIFIC GRAVITY, URINE: 1.003 — AB (ref 1.005–1.030)
pH: 7 (ref 5.0–8.0)

## 2017-04-09 NOTE — Discharge Instructions (Signed)

## 2017-04-09 NOTE — MAU Provider Note (Signed)
History     CSN: [id number]  Arrival date and time: 04/09/17 1355   None     No chief complaint on file.  HPI Vanessa Molina is 31 y.o. [phone] 5756w6d weeks presenting with report that the office instructed to come in because "the baby's breathing was not normal".  Had Non reactive NST and BPP in office--6/8 sent here for longer monitoring.  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  Denies pain, vaginal bleeding. + fetal movement.  She is a patient of Vanessa Molina, CNM at [facility].    Past Medical History:  Diagnosis Date  . Headaches, cluster   . Pregnancy induced hypertension     Past Surgical History:  Procedure Laterality Date  . CESAREAN SECTION     x2    Family History  Problem Relation Age of Onset  . Diabetes Maternal Aunt   . Diabetes Maternal Uncle   . Kidney disease Paternal Aunt   . Diabetes Paternal Aunt   . Kidney disease Paternal Uncle   . Diabetes Paternal Uncle     Social History  Substance Use Topics  . Smoking status: Never Smoker  . Smokeless tobacco: Never Used  . Alcohol use No    Allergies: No Known Allergies  Prescriptions Prior to Admission  Medication Sig Dispense Refill Last Dose  . aspirin EC 81 MG tablet Take 81 mg by mouth daily.   Taking  . Prenatal-DSS-FeCb-FeGl-FA (CITRANATAL BLOOM) 90-1 MG TABS Take 1 tablet by mouth daily. 30 tablet 12 Taking    Review of Systems  Cardiovascular: Positive for chest pain.  Gastrointestinal: Negative for abdominal pain.  Genitourinary: Negative for vaginal bleeding.       + for perceived fetal movement.  Neurological: Negative for headaches.   Physical Exam   Blood pressure 127/65, pulse 100, temperature 98.1 F (36.7 C), temperature source Oral, resp. rate 16, weight 170 lb 1.3 oz (77.1 kg), last menstrual period 08/01/2016, SpO2 100 %.  Physical  Exam  Nursing note and vitals reviewed. Constitutional: She is oriented to person, place, and time. She appears well-developed and well-nourished. No distress.  HENT:  Head: Normocephalic.  Neck: Normal range of motion.  Cardiovascular: Normal rate.   Respiratory: Effort normal.  Genitourinary:  Genitourinary Comments: Deferred-neg for vaginal bleeding or loss of fluid.  + for fetal movement.   Neurological: She is alert and oriented to person, place, and time.  Skin: Skin is warm and dry.  Psychiatric: She has a normal mood and affect. Her behavior is normal. Thought content normal.   Results for orders placed or performed during the hospital encounter of 04/09/17 (from the past 24 hour(s))  Urinalysis, Routine w reflex microscopic     Status: Abnormal   Collection Time: 04/09/17  2:08 PM  Result Value Ref Range   Color, Urine STRAW (A) YELLOW   APPearance CLEAR CLEAR   Specific Gravity, Urine 1.003 (L) 1.005 - 1.030   pH 7.0 5.0 - 8.0   Glucose, UA NEGATIVE NEGATIVE mg/dL   Hgb urine dipstick SMALL (A) NEGATIVE   Bilirubin Urine NEGATIVE NEGATIVE   Ketones, ur NEGATIVE NEGATIVE mg/dL   Protein, ur NEGATIVE NEGATIVE mg/dL   Nitrite NEGATIVE NEGATIVE   Leukocytes, UA NEGATIVE NEGATIVE   RBC / HPF 0-5 0 - 5 RBC/hpf   WBC, UA 0-5 0 - 5 WBC/hpf  Bacteria, UA FEW (A) NONE SEEN   Squamous Epithelial / LPF 0-5 (A) NONE SEEN    MAU Course  Procedures  MDM MSE Language barrier-interpreter NST-Baseline 135          Moderate variability          15X15s noted; 1 quick variable           Reactive  Consulted with Dr. Vergie Living.  He had received call about patient coming over for NST.  He reviewed Fetal monitoring strip--order given to discharge to home with strict instructions to follow up with scheduled BPP tomorrow at [facility].   Interpreter used to give NST results and discharge planning to patient.    Assessment and Plan  A:  Non reactive NST in office-  Reactive NST here.          [redacted]w[redacted]d gestation      History of pre-eclampsia      History of prior neonatal death.   P: instructed patient to return to [facility] tomorrow at 3:15 to repeat NST/BPP      Return before that time if she has decreased fetal movement, headaches, loss of fluid, vaginal bleeding or     contractions.  She agreed.    Vanessa Molina 04/09/2017, 2:53 PM

## 2017-04-09 NOTE — Progress Notes (Addendum)
   PRENATAL VISIT NOTE  Subjective:  Vanessa Molina is a 31 y.o. [id number] at 3646w6d being seen today for ongoing prenatal care.  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  Patient reports no complaints.  Contractions: Not present. Vag. Bleeding: None.  Movement: Present. Denies leaking of fluid.   The following portions of the patient's history were reviewed and updated as appropriate: allergies, current medications, past family history, past medical history, past social history, past surgical history and problem list. Problem list updated.  Objective:   Vitals:   04/09/17 1102  BP: 97/65  Pulse: 85  Weight: 169 lb 12.8 oz (77 kg)    Fetal Status: Fetal Heart Rate (bpm): NST; non-reactive Fundal Height: 36 cm Movement: Present     General:  Alert, oriented and cooperative. Patient is in no acute distress.  Skin: Skin is warm and dry. No rash noted.   Cardiovascular: Normal heart rate noted  Respiratory: Normal respiratory effort, no problems with respiration noted  Abdomen: Soft, gravid, appropriate for gestational age. Pain/Pressure: Absent     Pelvic:  Cervical exam deferred        Extremities: Normal range of motion.  Edema: None  Mental Status: Normal mood and affect. Normal behavior. Normal judgment and thought content.  NST: no accels, multiple variable decels noted to low 100's, moderate variability, Cat. 2 tracing. No contractions on toco.   Assessment and Plan:  Pregnancy: [id number] at 6746w6d  1. Supervision of high risk pregnancy, antepartum       - US MFM FETAL BPP W/NONSTRESS; Future  2. Pregnancy with history of neonatal  death     NON-Reactive NST - US MFM FETAL BPP W/NONSTRESS; Future  3. History of severe pre-eclampsia     Taking baby ASA  4. Language barrier       5. Delivery with history of cesarean section     Repeat C-section scheduled for 05/01/17  6. Non-reactive NST (non-stress test)     BPP ordered for today, variables noted on NST - US MFM FETAL BPP W/NONSTRESS; Future     BPP 6/8 today; 2 off for breathing.  Sent to MAU per Dr. Debroah LoopArnold for prolonged monitoring.      F/U BPP ordered for 04/10/17.   Preterm labor symptoms and general obstetric precautions including but not limited to vaginal bleeding, contractions, leaking of fluid and fetal movement were reviewed in detail with the patient. Please refer to After Visit Summary for other counseling recommendations.  [facility]around [facility]16/2017[facility] Needs to see FP MD here, Bi-weekly Antenatal testing.   Roe Coombsachelle A Denney, CNM

## 2017-04-09 NOTE — Addendum Note (Signed)
Addended by: Samantha CrimesENNEY, Adair Lemar ANNE on: 04/09/2017 01:58 PM   Modules accepted: Orders

## 2017-04-09 NOTE — MAU Note (Signed)
Pt reports that the office told her to come in because the "baby's breathing was not normal". States she had a BPP in the office but doesn't know the score.

## 2017-04-09 NOTE — Progress Notes (Signed)
Patient reports good fetal movement, denies pain/contractions. 

## 2017-04-10 ENCOUNTER — Inpatient Hospital Stay (HOSPITAL_COMMUNITY)
Admission: AD | Admit: 2017-04-10 | Discharge: 2017-04-10 | Disposition: A | Discharge status: Home / Self Care | Payer: Medicaid Other | Source: Ambulatory Visit | Attending: Obstetrics and Gynecology | Admitting: Obstetrics and Gynecology

## 2017-04-10 ENCOUNTER — Ambulatory Visit (HOSPITAL_COMMUNITY)
Admit: 2017-04-10 | Discharge: 2017-04-10 | Disposition: A | Discharge status: Home / Self Care | Payer: Medicaid Other | Attending: Certified Nurse Midwife | Admitting: Certified Nurse Midwife

## 2017-04-10 ENCOUNTER — Encounter (HOSPITAL_COMMUNITY): Payer: Self-pay

## 2017-04-10 DIAGNOSIS — Z3A36 36 weeks gestation of pregnancy: Secondary | ICD-10-CM | POA: Insufficient documentation

## 2017-04-10 DIAGNOSIS — O288 Other abnormal findings on antenatal screening of mother: Secondary | ICD-10-CM

## 2017-04-10 DIAGNOSIS — O09299 Supervision of pregnancy with other poor reproductive or obstetric history, unspecified trimester: Secondary | ICD-10-CM | POA: Diagnosis not present

## 2017-04-10 DIAGNOSIS — O09293 Supervision of pregnancy with other poor reproductive or obstetric history, third trimester: Secondary | ICD-10-CM | POA: Diagnosis present

## 2017-04-10 DIAGNOSIS — Z7982 Long term (current) use of aspirin: Secondary | ICD-10-CM | POA: Insufficient documentation

## 2017-04-10 DIAGNOSIS — O34219 Maternal care for unspecified type scar from previous cesarean delivery: Secondary | ICD-10-CM | POA: Insufficient documentation

## 2017-04-10 NOTE — MAU Provider Note (Signed)
  History     CSN: [id number]  Arrival date and time: 04/10/17 1712   None     No chief complaint on file.  HPI 31 yo [id number] IUP 36 weeks presents to MAU d/t 6/8 BPP today in MFM. Pt receiving twice weekly antenatal testing d/t prior FDIU at 33 weeks in [place], H/O PEC and H/O c section.  Similar episode yesterday but reactive NST in MAU.  She reports + FM. Denies VB or LOF    Past Medical History:  Diagnosis Date  . Headaches, cluster   . Pregnancy induced hypertension     Past Surgical History:  Procedure Laterality Date  . CESAREAN SECTION     x2    Family History  Problem Relation Age of Onset  . Diabetes Maternal Aunt   . Diabetes Maternal Uncle   . Kidney disease Paternal Aunt   . Diabetes Paternal Aunt   . Kidney disease Paternal Uncle   . Diabetes Paternal Uncle     Social History  Substance Use Topics  . Smoking status: Never Smoker  . Smokeless tobacco: Never Used  . Alcohol use No    Allergies: No Known Allergies  Prescriptions Prior to Admission  Medication Sig Dispense Refill Last Dose  . aspirin EC 81 MG tablet Take 81 mg by mouth daily.   04/09/2017 at Unknown time  . Prenatal-DSS-FeCb-FeGl-FA (CITRANATAL BLOOM) 90-1 MG TABS Take 1 tablet by mouth daily. 30 tablet 12 04/09/2017 at Unknown time    Review of Systems Physical Exam   Blood pressure 113/73, pulse 74, temperature 98 F (36.7 C), last menstrual period 08/01/2016, SpO2 96 %. Reactive NST  Physical Exam  Cardiovascular: Normal rate and regular rhythm.   Respiratory: Effort normal and breath sounds normal.  GI: Soft. Bowel sounds are normal.  gravid  Genitourinary:  Genitourinary Comments: Deffered    MAU Course  Procedures NST   Assessment and Plan  IUP 36 weeks 8/10 BPP  Per MFM able to discharge home and have follow up NST in office on Thursday.   Hermina StaggersMichael L Evanne Matsunaga 04/10/2017, 7:28 PM

## 2017-04-11 ENCOUNTER — Other Ambulatory Visit: Payer: Self-pay | Admitting: Certified Nurse Midwife

## 2017-04-11 ENCOUNTER — Telehealth: Payer: Self-pay | Admitting: *Deleted

## 2017-04-11 DIAGNOSIS — O09299 Supervision of pregnancy with other poor reproductive or obstetric history, unspecified trimester: Secondary | ICD-10-CM

## 2017-04-11 DIAGNOSIS — O288 Other abnormal findings on antenatal screening of mother: Secondary | ICD-10-CM

## 2017-04-11 DIAGNOSIS — O0990 Supervision of high risk pregnancy, unspecified, unspecified trimester: Secondary | ICD-10-CM

## 2017-04-11 NOTE — Telephone Encounter (Signed)
-----   Message from Hermina StaggersMichael L Ervin, MD sent at 04/10/2017  7:36 PM EDT ----- Please call pt with appt on Thursday for NST Thanks Casimiro NeedleMichael

## 2017-04-11 NOTE — Telephone Encounter (Signed)
Attempt to contact pt. LM on VM at both numbers in chart making aware of appt here in office tomorrow 04/12/17.

## 2017-04-12 ENCOUNTER — Encounter (HOSPITAL_COMMUNITY): Payer: Self-pay | Admitting: *Deleted

## 2017-04-12 ENCOUNTER — Inpatient Hospital Stay (HOSPITAL_COMMUNITY)
Admission: AD | Admit: 2017-04-12 | Discharge: 2017-04-12 | Disposition: A | Discharge status: Home / Self Care | Payer: Medicaid Other | Source: Ambulatory Visit | Attending: Family Medicine | Admitting: Family Medicine

## 2017-04-12 ENCOUNTER — Ambulatory Visit (INDEPENDENT_AMBULATORY_CARE_PROVIDER_SITE_OTHER): Payer: Medicaid Other | Admitting: Certified Nurse Midwife

## 2017-04-12 ENCOUNTER — Inpatient Hospital Stay (HOSPITAL_COMMUNITY): Payer: Medicaid Other

## 2017-04-12 ENCOUNTER — Other Ambulatory Visit: Payer: Self-pay | Admitting: Certified Nurse Midwife

## 2017-04-12 ENCOUNTER — Encounter: Payer: Self-pay | Admitting: Certified Nurse Midwife

## 2017-04-12 VITALS — BP 105/67 | HR 80 | Wt 169.0 lb

## 2017-04-12 DIAGNOSIS — O09299 Supervision of pregnancy with other poor reproductive or obstetric history, unspecified trimester: Secondary | ICD-10-CM | POA: Insufficient documentation

## 2017-04-12 DIAGNOSIS — O09293 Supervision of pregnancy with other poor reproductive or obstetric history, third trimester: Secondary | ICD-10-CM | POA: Diagnosis not present

## 2017-04-12 DIAGNOSIS — O34219 Maternal care for unspecified type scar from previous cesarean delivery: Secondary | ICD-10-CM | POA: Insufficient documentation

## 2017-04-12 DIAGNOSIS — O0990 Supervision of high risk pregnancy, unspecified, unspecified trimester: Secondary | ICD-10-CM

## 2017-04-12 DIAGNOSIS — Z3A36 36 weeks gestation of pregnancy: Secondary | ICD-10-CM | POA: Diagnosis not present

## 2017-04-12 DIAGNOSIS — O288 Other abnormal findings on antenatal screening of mother: Secondary | ICD-10-CM | POA: Diagnosis not present

## 2017-04-12 DIAGNOSIS — O0993 Supervision of high risk pregnancy, unspecified, third trimester: Secondary | ICD-10-CM

## 2017-04-12 NOTE — MAU Note (Signed)
Pt at appointment today. Sent for prolonged monitoring for non reactive NST. Denies pain or cramping. Good fetal movement reported

## 2017-04-12 NOTE — MAU Provider Note (Signed)
Chief Complaint:  Non-stress Test   Provider saw patient at 1300hrs   HPI: Vanessa Molina is a 31 y.o. [phone] at 7436w2dwho presents to maternity admissions reporting nonreactive NST in office.  Has been seen frequently for this this week.  See below for details. She reports good fetal movement, denies LOF, vaginal bleeding, vaginal itching/burning, urinary symptoms, h/a, dizziness, n/v, diarrhea, constipation or fever/chills.  She denies headache, visual changes or RUQ abdominal pain.  Other  This is a recurrent problem. The current episode started today. The problem occurs intermittently. The problem has been gradually improving. Pertinent negatives include no abdominal pain, fever, headaches, myalgias, nausea, visual change, vomiting or weakness. Nothing aggravates the symptoms. She has tried nothing for the symptoms.    RN Note: Pt was seen in office today and sent to MAU for monitoring. Pt denies pain, leaking or bleeding  Office Note: Non-reactive NST.  Discussed with Dr. Jolayne Pantheronstant; recommended prolonged monitoring.  [facility].  BPPs on 6/4 & 6/5: 6/8 (2 off for breathing).  Given previous obstetrical history, needs plan.  Has repeat C-section scheduled for 05/01/17.  2 previous C-sections with one IUFD the last one and previous to that fetal demise in NICU after [place]  Dr. Rande LawmanErwin aware on 04/10/17 of non-reactive NST and BPP 6/8, wanted NST today in office today.    Past Medical History: Past Medical History:  Diagnosis Date  . Headaches, cluster   . Pregnancy induced hypertension     Past obstetric history: OB History  Gravida Para Term Preterm AB Living  4 2 1 1 1 1   SAB TAB Ectopic Multiple Live Births  1 0 0 0 2    # Outcome Date GA Lbr Len/2nd Weight Sex Delivery Anes PTL Lv  4 Current           3 SAB 09/07/15          2 Term 01/26/11 8083w0d   M CS-LTranv   LIV     Complications: PIH (pregnancy induced  hypertension)  1 Preterm 12/15/07 2110w0d    CS-LTranv   DEC      Past Surgical History: Past Surgical History:  Procedure Laterality Date  . CESAREAN SECTION     x2    Family History: Family History  Problem Relation Age of Onset  . Diabetes Maternal Aunt   . Diabetes Maternal Uncle   . Kidney disease Paternal Aunt   . Diabetes Paternal Aunt   . Kidney disease Paternal Uncle   . Diabetes Paternal Uncle     Social History: Social History  Substance Use Topics  . Smoking status: Never Smoker  . Smokeless tobacco: Never Used  . Alcohol use No    Allergies: No Known Allergies  Meds:  Prescriptions Prior to Admission  Medication Sig Dispense Refill Last Dose  . acetaminophen (TYLENOL) 325 MG tablet Take 650 mg by mouth every 6 (six) hours as needed for headache.   Past Week at Unknown time  . aspirin EC 81 MG tablet Take 81 mg by mouth daily.   04/11/2017 at Unknown time  . omeprazole (PRILOSEC OTC) 20 MG tablet Take 40 mg by mouth daily as needed (heartburn).   04/11/2017 at Unknown time  . Prenatal-DSS-FeCb-FeGl-FA (CITRANATAL BLOOM) 90-1 MG TABS Take 1 tablet by mouth daily. 30 tablet 12 04/11/2017 at Unknown time    I have reviewed patient's Past Medical Hx, Surgical Hx, Family Hx, Social Hx, medications and  allergies.   ROS:  Review of Systems  Constitutional: Negative for fever.  Gastrointestinal: Negative for abdominal pain, nausea and vomiting.  Musculoskeletal: Negative for myalgias.  Neurological: Negative for weakness and headaches.   Other systems negative  Physical Exam  Patient Vitals for the past 24 hrs:  BP Temp Temp src Pulse Resp Height Weight  04/12/17 1241 111/65 98.3 F (36.8 C) Oral 83 18 5' 3.75" (1.619 m) 166 lb 0.6 oz (75.3 kg)   Constitutional: Well-developed, well-nourished female in no acute distress.  Cardiovascular: normal rate and rhythm Respiratory: normal effort, clear to auscultation bilaterally GI: Abd soft, non-tender, gravid  appropriate for gestational age.   No rebound or guarding. MS: Extremities nontender, no edema, normal ROM Neurologic: Alert and oriented x 4.  GU: Neg CVAT.  PELVIC EXAM:  Deferred  FHT:  Baseline 130 , moderate variability, accelerations present, no decelerations Contractions:  Irregular and infrequent   Labs: No results found for this or any previous visit (from the past 24 hour(s)). O/Positive/-- (12/07 1235)  Imaging:  BPP 6/8 or 8/10 (reactive NST)  with 2 off for breathing AFI:  18.14cm  MAU Course/MDM: I have ordered labs and reviewed results.  NST reviewed and found to be reactive with average variability and multiple accelerations which meet 15x15 criteria.  No decelerations.  Consult Dr Shawnie Pons with presentation, exam findings and test results.  Treatments in MAU included EFM, prolonged.    Assessment: 1. NST (non-stress test) nonreactive   2.     BPP 8/10 with reactive NST 3.     Planned Cesarean Section  Plan: Discharge home Labor precautions and fetal kick counts If she has any contractions, leaking, bleeding or decreased movement will come back here. Follow up in Office as scheduled on Monday for prenatal visits and recheck of FHR  Encouraged to return here or to other Urgent Care/ED if she develops worsening of symptoms, increase in pain, fever, or other concerning symptoms.   Pt stable at time of discharge.  Wynelle Bourgeois CNM, MSN Certified Nurse-Midwife 04/12/2017 3:42 PM

## 2017-04-12 NOTE — Discharge Instructions (Signed)
Third Trimester of Pregnancy The third trimester is from week 28 through week 40 (months 7 through 9). The third trimester is a time when the unborn baby (fetus) is growing rapidly. At the end of the ninth month, the fetus is about 20 inches in length and weighs 6-10 pounds. Body changes during your third trimester Your body will continue to go through many changes during pregnancy. The changes vary from woman to woman. During the third trimester:  Your weight will continue to increase. You can expect to gain 25-35 pounds (11-16 kg) by the end of the pregnancy.  You may begin to get stretch marks on your hips, abdomen, and breasts.  You may urinate more often because the fetus is moving lower into your pelvis and pressing on your bladder.  You may develop or continue to have heartburn. This is caused by increased hormones that slow down muscles in the digestive tract.  You may develop or continue to have constipation because increased hormones slow digestion and cause the muscles that push waste through your intestines to relax.  You may develop hemorrhoids. These are swollen veins (varicose veins) in the rectum that can itch or be painful.  You may develop swollen, bulging veins (varicose veins) in your legs.  You may have increased body aches in the pelvis, back, or thighs. This is due to weight gain and increased hormones that are relaxing your joints.  You may have changes in your hair. These can include thickening of your hair, rapid growth, and changes in texture. Some women also have hair loss during or after pregnancy, or hair that feels dry or thin. Your hair will most likely return to normal after your baby is born.  Your breasts will continue to grow and they will continue to become tender. A yellow fluid (colostrum) may leak from your breasts. This is the first milk you are producing for your baby.  Your belly button may stick out.  You may notice more swelling in your hands,  face, or ankles.  You may have increased tingling or numbness in your hands, arms, and legs. The skin on your belly may also feel numb.  You may feel short of breath because of your expanding uterus.  You may have more problems sleeping. This can be caused by the size of your belly, increased need to urinate, and an increase in your body's metabolism.  You may notice the fetus "dropping," or moving lower in your abdomen (lightening).  You may have increased vaginal discharge.  You may notice your joints feel loose and you may have pain around your pelvic bone.  What to expect at prenatal visits You will have prenatal exams every 2 weeks until week 36. Then you will have weekly prenatal exams. During a routine prenatal visit:  You will be weighed to make sure you and the baby are growing normally.  Your blood pressure will be taken.  Your abdomen will be measured to track your baby's growth.  The fetal heartbeat will be listened to.  Any test results from the previous visit will be discussed.  You may have a cervical check near your due date to see if your cervix has softened or thinned (effaced).  You will be tested for Group B streptococcus. This happens between 35 and 37 weeks.  Your health care provider may ask you:  What your birth plan is.  How you are feeling.  If you are feeling the baby move.  If you have had   any abnormal symptoms, such as leaking fluid, bleeding, severe headaches, or abdominal cramping.  If you are using any tobacco products, including cigarettes, chewing tobacco, and electronic cigarettes.  If you have any questions.  Other tests or screenings that may be performed during your third trimester include:  Blood tests that check for low iron levels (anemia).  Fetal testing to check the health, activity level, and growth of the fetus. Testing is done if you have certain medical conditions or if there are problems during the  pregnancy.  Nonstress test (NST). This test checks the health of your baby to make sure there are no signs of problems, such as the baby not getting enough oxygen. During this test, a belt is placed around your belly. The baby is made to move, and its heart rate is monitored during movement.  What is false labor? False labor is a condition in which you feel small, irregular tightenings of the muscles in the womb (contractions) that usually go away with rest, changing position, or drinking water. These are called Braxton Hicks contractions. Contractions may last for hours, days, or even weeks before true labor sets in. If contractions come at regular intervals, become more frequent, increase in intensity, or become painful, you should see your health care provider. What are the signs of labor?  Abdominal cramps.  Regular contractions that start at 10 minutes apart and become stronger and more frequent with time.  Contractions that start on the top of the uterus and spread down to the lower abdomen and back.  Increased pelvic pressure and dull back pain.  A watery or bloody mucus discharge that comes from the vagina.  Leaking of amniotic fluid. This is also known as your "water breaking." It could be a slow trickle or a gush. Let your health care provider know if it has a color or strange odor. If you have any of these signs, call your health care provider right away, even if it is before your due date. Follow these instructions at home: Medicines  Follow your health care provider's instructions regarding medicine use. Specific medicines may be either safe or unsafe to take during pregnancy.  Take a prenatal vitamin that contains at least 600 micrograms (mcg) of folic acid.  If you develop constipation, try taking a stool softener if your health care provider approves. Eating and drinking  Eat a balanced diet that includes fresh fruits and vegetables, whole grains, good sources of protein  such as meat, eggs, or tofu, and low-fat dairy. Your health care provider will help you determine the amount of weight gain that is right for you.  Avoid raw meat and uncooked cheese. These carry germs that can cause birth defects in the baby.  If you have low calcium intake from food, talk to your health care provider about whether you should take a daily calcium supplement.  Eat four or five small meals rather than three large meals a day.  Limit foods that are high in fat and processed sugars, such as fried and sweet foods.  To prevent constipation: ? Drink enough fluid to keep your urine clear or pale yellow. ? Eat foods that are high in fiber, such as fresh fruits and vegetables, whole grains, and beans. Activity  Exercise only as directed by your health care provider. Most women can continue their usual exercise routine during pregnancy. Try to exercise for 30 minutes at least 5 days a week. Stop exercising if you experience uterine contractions.  Avoid heavy   lifting.  Do not exercise in extreme heat or humidity, or at high altitudes.  Wear low-heel, comfortable shoes.  Practice good posture.  You may continue to have sex unless your health care provider tells you otherwise. Relieving pain and discomfort  Take frequent breaks and rest with your legs elevated if you have leg cramps or low back pain.  Take warm sitz baths to soothe any pain or discomfort caused by hemorrhoids. Use hemorrhoid cream if your health care provider approves.  Wear a good support bra to prevent discomfort from breast tenderness.  If you develop varicose veins: ? Wear support pantyhose or compression stockings as told by your healthcare provider. ? Elevate your feet for 15 minutes, 3-4 times a day. Prenatal care  Write down your questions. Take them to your prenatal visits.  Keep all your prenatal visits as told by your health care provider. This is important. Safety  Wear your seat belt at  all times when driving.  Make a list of emergency phone numbers, including numbers for family, friends, the hospital, and police and fire departments. General instructions  Avoid cat litter boxes and soil used by cats. These carry germs that can cause birth defects in the baby. If you have a cat, ask someone to clean the litter box for you.  Do not travel far distances unless it is absolutely necessary and only with the approval of your health care provider.  Do not use hot tubs, steam rooms, or saunas.  Do not drink alcohol.  Do not use any products that contain nicotine or tobacco, such as cigarettes and e-cigarettes. If you need help quitting, ask your health care provider.  Do not use any medicinal herbs or unprescribed drugs. These chemicals affect the formation and growth of the baby.  Do not douche or use tampons or scented sanitary pads.  Do not cross your legs for long periods of time.  To prepare for the arrival of your baby: ? Take prenatal classes to understand, practice, and ask questions about labor and delivery. ? Make a trial run to the hospital. ? Visit the hospital and tour the maternity area. ? Arrange for maternity or paternity leave through employers. ? Arrange for family and friends to take care of pets while you are in the hospital. ? Purchase a rear-facing car seat and make sure you know how to install it in your car. ? Pack your hospital bag. ? Prepare the baby's nursery. Make sure to remove all pillows and stuffed animals from the baby's crib to prevent suffocation.  Visit your dentist if you have not gone during your pregnancy. Use a soft toothbrush to brush your teeth and be gentle when you floss. Contact a health care provider if:  You are unsure if you are in labor or if your water has broken.  You become dizzy.  You have mild pelvic cramps, pelvic pressure, or nagging pain in your abdominal area.  You have lower back pain.  You have persistent  nausea, vomiting, or diarrhea.  You have an unusual or bad smelling vaginal discharge.  You have pain when you urinate. Get help right away if:  Your water breaks before 37 weeks.  You have regular contractions less than 5 minutes apart before 37 weeks.  You have a fever.  You are leaking fluid from your vagina.  You have spotting or bleeding from your vagina.  You have severe abdominal pain or cramping.  You have rapid weight loss or weight gain.    You have shortness of breath with chest pain.  You notice sudden or extreme swelling of your face, hands, ankles, feet, or legs.  Your baby makes fewer than 10 movements in 2 hours.  You have severe headaches that do not go away when you take medicine.  You have vision changes. Summary  The third trimester is from week 28 through week 40, months 7 through 9. The third trimester is a time when the unborn baby (fetus) is growing rapidly.  During the third trimester, your discomfort may increase as you and your baby continue to gain weight. You may have abdominal, leg, and back pain, sleeping problems, and an increased need to urinate.  During the third trimester your breasts will keep growing and they will continue to become tender. A yellow fluid (colostrum) may leak from your breasts. This is the first milk you are producing for your baby.  False labor is a condition in which you feel small, irregular tightenings of the muscles in the womb (contractions) that eventually go away. These are called Braxton Hicks contractions. Contractions may last for hours, days, or even weeks before true labor sets in.  Signs of labor can include: abdominal cramps; regular contractions that start at 10 minutes apart and become stronger and more frequent with time; watery or bloody mucus discharge that comes from the vagina; increased pelvic pressure and dull back pain; and leaking of amniotic fluid. This information is not intended to replace advice  given to you by your health care provider. Make sure you discuss any questions you have with your health care provider. Document Released: 10/17/2001 Document Revised: 03/30/2016 Document Reviewed: 12/24/2012 Elsevier Interactive Patient Education  2017 [employer]  

## 2017-04-12 NOTE — MAU Note (Signed)
Pt was seen in office today and sent to MAU for monitoring. Pt denies pain, leaking or bleeding

## 2017-04-12 NOTE — Progress Notes (Signed)
   PRENATAL VISIT NOTE  Subjective:  Vanessa Molina is a 31 y.o. [phone] at 8048w2d being seen today for ongoing prenatal care.  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  Patient reports no complaints.  Contractions: Not present.  .  Movement: Present. Denies leaking of fluid.   The following portions of the patient's history were reviewed and updated as appropriate: allergies, current medications, past family history, past medical history, past social history, past surgical history and problem list. Problem list updated.  Objective:   Vitals:   04/12/17 1112  BP: 105/67  Pulse: 80  Weight: 169 lb (76.7 kg)    Fetal Status: Fetal Heart Rate (bpm): NST   Movement: Present     General:  Alert, oriented and cooperative. Patient is in no acute distress.  Skin: Skin is warm and dry. No rash noted.   Cardiovascular: Normal heart rate noted  Respiratory: Normal respiratory effort, no problems with respiration noted  Abdomen: Soft, gravid, appropriate for gestational age. Pain/Pressure: Absent     Pelvic:  Cervical exam deferred        Extremities: Normal range of motion.     Mental Status: Normal mood and affect. Normal behavior. Normal judgment and thought content.  NST: noaccels, no decels, moderate variability, Cat. 2 tracing. No contractions on toco.   Assessment and Plan:  Pregnancy: [id number] at 1448w2d  1. Supervision of high risk pregnancy, antepartum    Non-reactive NST.  Discussed with Dr. Jolayne Pantheronstant; recommended prolonged monitoring.  [facility]  BPPs on 6/4 & 6/5: 6/8 (2 off for breathing).  Given previous obstetrical  history, needs plan.  Has repeat C-section scheduled for 05/01/17.  2 previous C-sections with one IUFD the last one and previous to that fetal demise in NICU [place]  Dr. Rande LawmanErwin aware on 04/10/17 of non-reactive NST and BPP 6/8, wanted NST today in office today.     2. Pregnancy with history of neonatal death    Non-reactive NST: to MAU for prolonged monitoring.  - Fetal nonstress test; Future  Preterm labor symptoms and general obstetric precautions including but not limited to vaginal bleeding, contractions, leaking of fluid and fetal movement were reviewed in detail with the patient. Please refer to After Visit Summary for other counseling recommendations.  Has an appointment scheduled for Monday with NST with Dr. Jolayne Pantheronstant.    Roe Coombsachelle A Denney, CNM

## 2017-04-16 ENCOUNTER — Ambulatory Visit (INDEPENDENT_AMBULATORY_CARE_PROVIDER_SITE_OTHER): Payer: Medicaid Other | Admitting: Obstetrics and Gynecology

## 2017-04-16 ENCOUNTER — Ambulatory Visit (HOSPITAL_COMMUNITY)
Admission: RE | Admit: 2017-04-16 | Discharge: 2017-04-16 | Disposition: A | Discharge status: Home / Self Care | Payer: Medicaid Other | Source: Ambulatory Visit | Attending: Obstetrics and Gynecology | Admitting: Obstetrics and Gynecology

## 2017-04-16 VITALS — BP 113/71 | HR 98 | Wt 173.0 lb

## 2017-04-16 DIAGNOSIS — O0990 Supervision of high risk pregnancy, unspecified, unspecified trimester: Secondary | ICD-10-CM

## 2017-04-16 DIAGNOSIS — Z789 Other specified health status: Secondary | ICD-10-CM

## 2017-04-16 DIAGNOSIS — O0993 Supervision of high risk pregnancy, unspecified, third trimester: Secondary | ICD-10-CM | POA: Diagnosis not present

## 2017-04-16 DIAGNOSIS — O34219 Maternal care for unspecified type scar from previous cesarean delivery: Secondary | ICD-10-CM

## 2017-04-16 DIAGNOSIS — Z3A36 36 weeks gestation of pregnancy: Secondary | ICD-10-CM | POA: Insufficient documentation

## 2017-04-16 DIAGNOSIS — O09299 Supervision of pregnancy with other poor reproductive or obstetric history, unspecified trimester: Secondary | ICD-10-CM | POA: Diagnosis not present

## 2017-04-16 DIAGNOSIS — Z8759 Personal history of other complications of pregnancy, childbirth and the puerperium: Secondary | ICD-10-CM

## 2017-04-16 DIAGNOSIS — O09899 Supervision of other high risk pregnancies, unspecified trimester: Secondary | ICD-10-CM

## 2017-04-16 DIAGNOSIS — Z283 Underimmunization status: Secondary | ICD-10-CM

## 2017-04-16 NOTE — Progress Notes (Addendum)
   PRENATAL VISIT NOTE  Subjective:  Vanessa Molina is a 31 y.o. [id number] at 7531w6d being seen today for ongoing prenatal care.  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  Patient reports no complaints.  Contractions: Not present. Vag. Bleeding: None.  Movement: Present. Denies leaking of fluid.   The following portions of the patient's history were reviewed and updated as appropriate: allergies, current medications, past family history, past medical history, past social history, past surgical history and problem list. Problem list updated.  Objective:   Vitals:   04/16/17 1443  BP: 113/71  Pulse: 98  Weight: 173 lb (78.5 kg)    Fetal Status: Fetal Heart Rate (bpm): NST   Movement: Present     General:  Alert, oriented and cooperative. Patient is in no acute distress.  Skin: Skin is warm and dry. No rash noted.   Cardiovascular: Normal heart rate noted  Respiratory: Normal respiratory effort, no problems with respiration noted  Abdomen: Soft, gravid, appropriate for gestational age. Pain/Pressure: Absent     Pelvic:  Cervical exam deferred        Extremities: Normal range of motion.     Mental Status: Normal mood and affect. Normal behavior. Normal judgment and thought content.   Assessment and Plan:  Pregnancy: [id number] at 5931w6d  1. Supervision of high risk pregnancy, antepartum Patient is doing well without complaints NST reviewed and non reactive with baseline 150, mod variability no accels, occ variable decels. Patient sent for STAT BPP  2. History of severe pre-eclampsia Last dose of ASA today Normotensive  3. Delivery with  history of cesarean section Plan for repeat c-section at 39 weeks  4. Maternal varicella, non-immune Will offer pp  5. Language barrier   Preterm labor symptoms and general obstetric precautions including but not limited to vaginal bleeding, contractions, leaking of fluid and fetal movement were reviewed in detail with the patient. Please refer to After Visit Summary for other counseling recommendations.  No Follow-up on file.   Catalina AntiguaPeggy Amarise Lillo, MD

## 2017-04-19 ENCOUNTER — Ambulatory Visit (INDEPENDENT_AMBULATORY_CARE_PROVIDER_SITE_OTHER): Payer: Medicaid Other | Admitting: *Deleted

## 2017-04-19 DIAGNOSIS — O0990 Supervision of high risk pregnancy, unspecified, unspecified trimester: Secondary | ICD-10-CM

## 2017-04-19 DIAGNOSIS — O09293 Supervision of pregnancy with other poor reproductive or obstetric history, third trimester: Secondary | ICD-10-CM

## 2017-04-19 NOTE — Addendum Note (Signed)
Addended by: Catalina AntiguaONSTANT, Lavren Lewan on: 04/19/2017 11:42 AM   Modules accepted: Orders

## 2017-04-19 NOTE — Progress Notes (Signed)
Pt is in office today for NST. Pt has no complaints today, doing well.

## 2017-04-19 NOTE — Progress Notes (Signed)
6/14 NST reviewed and reactive with baseline 145, mod variability, +accels, no decels

## 2017-04-23 ENCOUNTER — Ambulatory Visit (INDEPENDENT_AMBULATORY_CARE_PROVIDER_SITE_OTHER): Payer: Medicaid Other | Admitting: Obstetrics and Gynecology

## 2017-04-23 ENCOUNTER — Telehealth (HOSPITAL_COMMUNITY): Payer: Self-pay | Admitting: *Deleted

## 2017-04-23 VITALS — BP 110/72 | HR 77 | Wt 173.0 lb

## 2017-04-23 DIAGNOSIS — O0993 Supervision of high risk pregnancy, unspecified, third trimester: Secondary | ICD-10-CM

## 2017-04-23 DIAGNOSIS — Z2839 Other underimmunization status: Secondary | ICD-10-CM

## 2017-04-23 DIAGNOSIS — O34219 Maternal care for unspecified type scar from previous cesarean delivery: Secondary | ICD-10-CM

## 2017-04-23 DIAGNOSIS — O09893 Supervision of other high risk pregnancies, third trimester: Secondary | ICD-10-CM | POA: Diagnosis not present

## 2017-04-23 DIAGNOSIS — Z283 Underimmunization status: Secondary | ICD-10-CM

## 2017-04-23 DIAGNOSIS — Z789 Other specified health status: Secondary | ICD-10-CM

## 2017-04-23 DIAGNOSIS — O0990 Supervision of high risk pregnancy, unspecified, unspecified trimester: Secondary | ICD-10-CM

## 2017-04-23 DIAGNOSIS — Z603 Acculturation difficulty: Secondary | ICD-10-CM

## 2017-04-23 DIAGNOSIS — Z8759 Personal history of other complications of pregnancy, childbirth and the puerperium: Secondary | ICD-10-CM

## 2017-04-23 DIAGNOSIS — O09293 Supervision of pregnancy with other poor reproductive or obstetric history, third trimester: Secondary | ICD-10-CM | POA: Diagnosis not present

## 2017-04-23 DIAGNOSIS — O09899 Supervision of other high risk pregnancies, unspecified trimester: Secondary | ICD-10-CM

## 2017-04-23 DIAGNOSIS — O09299 Supervision of pregnancy with other poor reproductive or obstetric history, unspecified trimester: Secondary | ICD-10-CM

## 2017-04-23 NOTE — Telephone Encounter (Signed)
Preadmission screen  

## 2017-04-23 NOTE — Progress Notes (Signed)
   PRENATAL VISIT NOTE  Subjective:  Vanessa Molina is a 31 y.o. [id number] at 6811w6d being seen today for ongoing prenatal care.  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  Patient reports no complaints.  Contractions: Not present. Vag. Bleeding: None.  Movement: Present. Denies leaking of fluid.   The following portions of the patient's history were reviewed and updated as appropriate: allergies, current medications, past family history, past medical history, past social history, past surgical history and problem list. Problem list updated.  Objective:   Vitals:   04/23/17 1019  BP: 110/72  Pulse: 77  Weight: 173 lb (78.5 kg)    Fetal Status: Fetal Heart Rate (bpm): NST   Movement: Present     General:  Alert, oriented and cooperative. Patient is in no acute distress.  Skin: Skin is warm and dry. No rash noted.   Cardiovascular: Normal heart rate noted  Respiratory: Normal respiratory effort, no problems with respiration noted  Abdomen: Soft, gravid, appropriate for gestational age. Pain/Pressure: Absent     Pelvic:  Cervical exam deferred        Extremities: Normal range of motion.     Mental Status: Normal mood and affect. Normal behavior. Normal judgment and thought content.   Assessment and Plan:  Pregnancy: [id number] at 3711w6d  1. Supervision of high risk pregnancy, antepartum Patient is doing well without complaints  2. History of severe pre-eclampsia S/p ASA  3. Pregnancy with history of neonatal death Scheduled for repeat c-section on 6/26 NST reviewed and reactive with baseline 130, mod variability, + accels, no  decels  4. Delivery with history of cesarean section Scheduled for repeat c-section  5. Maternal varicella, non-immune Will offer pp  6. Language barrier Interpreter used  Term labor symptoms and general obstetric precautions including but not limited to vaginal bleeding, contractions, leaking of fluid and fetal movement were reviewed in detail with the patient. Please refer to After Visit Summary for other counseling recommendations.  No Follow-up on file.   Catalina AntiguaPeggy Abijah Roussel, MD

## 2017-04-23 NOTE — Pre-Procedure Instructions (Signed)
interpreter number [phone]

## 2017-04-25 ENCOUNTER — Encounter (HOSPITAL_COMMUNITY): Payer: Self-pay

## 2017-04-25 NOTE — Pre-Procedure Instructions (Signed)
[phone]  interpreter number

## 2017-04-26 ENCOUNTER — Ambulatory Visit (INDEPENDENT_AMBULATORY_CARE_PROVIDER_SITE_OTHER): Payer: Medicaid Other | Admitting: Obstetrics and Gynecology

## 2017-04-26 ENCOUNTER — Ambulatory Visit (INDEPENDENT_AMBULATORY_CARE_PROVIDER_SITE_OTHER): Payer: Medicaid Other | Admitting: Family Medicine

## 2017-04-26 VITALS — BP 98/60 | HR 91

## 2017-04-26 DIAGNOSIS — O09299 Supervision of pregnancy with other poor reproductive or obstetric history, unspecified trimester: Secondary | ICD-10-CM

## 2017-04-26 DIAGNOSIS — O09293 Supervision of pregnancy with other poor reproductive or obstetric history, third trimester: Secondary | ICD-10-CM

## 2017-04-29 NOTE — Addendum Note (Signed)
Addended by: Arne ClevelandHUTCHINSON, MANDY J on: 04/29/2017 08:42 AM   Modules accepted: Orders

## 2017-04-30 ENCOUNTER — Encounter (HOSPITAL_COMMUNITY)
Admission: RE | Admit: 2017-04-30 | Discharge: 2017-04-30 | Disposition: A | Discharge status: Home / Self Care | Payer: Medicaid Other | Source: Ambulatory Visit | Attending: Family Medicine | Admitting: Family Medicine

## 2017-04-30 HISTORY — DX: Supervision of pregnancy with other poor reproductive or obstetric history, unspecified trimester: O09.299

## 2017-04-30 LAB — CBC
HCT: 33.1 % — ABNORMAL LOW (ref 36.0–46.0)
Hemoglobin: 10.9 g/dL — ABNORMAL LOW (ref 12.0–15.0)
MCH: 27.8 pg (ref 26.0–34.0)
MCHC: 32.9 g/dL (ref 30.0–36.0)
MCV: 84.4 fL (ref 78.0–100.0)
PLATELETS: 207 10*3/uL (ref 150–400)
RBC: 3.92 MIL/uL (ref 3.87–5.11)
RDW: 14.3 % (ref 11.5–15.5)
WBC: 4.6 10*3/uL (ref 4.0–10.5)

## 2017-04-30 LAB — TYPE AND SCREEN
ABO/RH(D): O POS
Antibody Screen: NEGATIVE

## 2017-04-30 LAB — RAPID HIV SCREEN (HIV 1/2 AB+AG)
HIV 1/2 Antibodies: NONREACTIVE
HIV-1 P24 Antigen - HIV24: NONREACTIVE

## 2017-04-30 NOTE — Progress Notes (Signed)
I personally reviewed the patient's NST today, found to be REACTIVE. 130 bpm, mod var, +accels, no decels. CTX: Occasional.   Cleda ClarksElizabeth W. Shalen Petrak, DO  [facility], [facility]

## 2017-04-30 NOTE — Patient Instructions (Signed)
20 Aamiyah Mcparland  04/30/2017   Your procedure is scheduled on:  05/01/2017  [facility] through [facility]tr[facility] at 0730 AM.  Pick up the phone at the desk and dial [phone].   Call this number if you have problems the morning of surgery: [phone]   Remember:   Do not eat food:After Midnight.  Do not drink clear liquids: After Midnight.  Take these medicines the morning of surgery with A SIP OF WATER: none   Do not wear jewelry, make-up or nail polish.  Do not wear lotions, powders, or perfumes. Do not wear deodorant.  Do not shave 48 hours prior to surgery.  Do not bring valuables to the hospital.  [facility] is not   responsible for any belongings or valuables brought to the hospital.  Contacts, dentures or bridgework may not be worn into surgery.  Leave suitcase in the car. After surgery it may be brought to your room.  For patients admitted to the hospital, checkout time is 11:00 AM the day of              discharge.   Patients discharged the day of surgery will not be allowed to drive             home.  Name and phone number of your driver: na   Special Instructions:   N/A   Please read over the following fact sheets that you were given:   Surgical Site Infection Prevention

## 2017-05-01 ENCOUNTER — Inpatient Hospital Stay (HOSPITAL_COMMUNITY): Payer: Medicaid Other | Admitting: Anesthesiology

## 2017-05-01 ENCOUNTER — Encounter (HOSPITAL_COMMUNITY)
Admission: RE | Disposition: A | Discharge status: Home / Self Care | Payer: Self-pay | Source: Ambulatory Visit | Attending: Family Medicine

## 2017-05-01 ENCOUNTER — Encounter (HOSPITAL_COMMUNITY): Payer: Self-pay | Admitting: *Deleted

## 2017-05-01 ENCOUNTER — Inpatient Hospital Stay (HOSPITAL_COMMUNITY)
Admission: RE | Admit: 2017-05-01 | Discharge: 2017-05-03 | DRG: 766 | Disposition: A | Discharge status: Home / Self Care | Payer: Medicaid Other | Source: Ambulatory Visit | Attending: Family Medicine | Admitting: Family Medicine

## 2017-05-01 DIAGNOSIS — D649 Anemia, unspecified: Secondary | ICD-10-CM | POA: Diagnosis present

## 2017-05-01 DIAGNOSIS — O34211 Maternal care for low transverse scar from previous cesarean delivery: Secondary | ICD-10-CM | POA: Diagnosis present

## 2017-05-01 DIAGNOSIS — O9902 Anemia complicating childbirth: Secondary | ICD-10-CM | POA: Diagnosis present

## 2017-05-01 DIAGNOSIS — Z3A39 39 weeks gestation of pregnancy: Secondary | ICD-10-CM

## 2017-05-01 DIAGNOSIS — Z98891 History of uterine scar from previous surgery: Secondary | ICD-10-CM

## 2017-05-01 LAB — RPR: RPR Ser Ql: NONREACTIVE

## 2017-05-01 SURGERY — Surgical Case
Anesthesia: Epidural | Site: Abdomen | Wound class: Clean Contaminated

## 2017-05-01 MED ORDER — DEXAMETHASONE SODIUM PHOSPHATE 10 MG/ML IJ SOLN
INTRAMUSCULAR | Status: AC
  Filled 2017-05-01: qty 1

## 2017-05-01 MED ORDER — TETANUS-DIPHTH-ACELL PERTUSSIS 5-2.5-18.5 LF-MCG/0.5 IM SUSP: 0.5000 mL | Freq: Once | INTRAMUSCULAR | Status: DC

## 2017-05-01 MED ORDER — ZOLPIDEM TARTRATE 5 MG PO TABS: 5.0000 mg | ORAL_TABLET | Freq: Every evening | ORAL | Status: DC | PRN

## 2017-05-01 MED ORDER — SCOPOLAMINE 1 MG/3DAYS TD PT72
MEDICATED_PATCH | TRANSDERMAL | Status: DC | PRN
  Administered 2017-05-01: 1 via TRANSDERMAL

## 2017-05-01 MED ORDER — PHENYLEPHRINE 40 MCG/ML (10ML) SYRINGE FOR IV PUSH (FOR BLOOD PRESSURE SUPPORT)
PREFILLED_SYRINGE | INTRAVENOUS | Status: AC
  Filled 2017-05-01: qty 10

## 2017-05-01 MED ORDER — ONDANSETRON HCL 4 MG/2ML IJ SOLN
INTRAMUSCULAR | Status: DC | PRN
  Administered 2017-05-01: 4 mg via INTRAVENOUS

## 2017-05-01 MED ORDER — IBUPROFEN 600 MG PO TABS
600.0000 mg | ORAL_TABLET | Freq: Four times a day (QID) | ORAL | Status: DC
  Administered 2017-05-01 – 2017-05-03 (×8): 600 mg via ORAL
  Filled 2017-05-01 (×8): qty 1

## 2017-05-01 MED ORDER — LACTATED RINGERS IV SOLN
125.0000 mL/h | INTRAVENOUS | Status: DC
  Administered 2017-05-01: 125 mL/h via INTRAVENOUS

## 2017-05-01 MED ORDER — DIPHENHYDRAMINE HCL 25 MG PO CAPS
25.0000 mg | ORAL_CAPSULE | Freq: Four times a day (QID) | ORAL | Status: DC | PRN
  Administered 2017-05-02: 25 mg via ORAL
  Filled 2017-05-01: qty 1

## 2017-05-01 MED ORDER — LACTATED RINGERS IV SOLN
INTRAVENOUS | Status: DC | PRN
  Administered 2017-05-01: 10:00:00 via INTRAVENOUS

## 2017-05-01 MED ORDER — SIMETHICONE 80 MG PO CHEW
80.0000 mg | CHEWABLE_TABLET | ORAL | Status: DC
  Administered 2017-05-01 – 2017-05-02 (×2): 80 mg via ORAL
  Filled 2017-05-01 (×2): qty 1

## 2017-05-01 MED ORDER — ONDANSETRON HCL 4 MG/2ML IJ SOLN
INTRAMUSCULAR | Status: AC
  Filled 2017-05-01: qty 2

## 2017-05-01 MED ORDER — SODIUM CHLORIDE 0.9% FLUSH
INTRAVENOUS | Status: AC
  Filled 2017-05-01: qty 3

## 2017-05-01 MED ORDER — COCONUT OIL OIL
1.0000 "application " | TOPICAL_OIL | Status: DC | PRN
  Administered 2017-05-02: 1 via TOPICAL
  Filled 2017-05-01: qty 120

## 2017-05-01 MED ORDER — OXYTOCIN 10 UNIT/ML IJ SOLN
INTRAVENOUS | Status: DC | PRN
  Administered 2017-05-01: 40 [IU] via INTRAVENOUS

## 2017-05-01 MED ORDER — SCOPOLAMINE 1 MG/3DAYS TD PT72
MEDICATED_PATCH | TRANSDERMAL | Status: AC
  Filled 2017-05-01: qty 1

## 2017-05-01 MED ORDER — SIMETHICONE 80 MG PO CHEW
80.0000 mg | CHEWABLE_TABLET | Freq: Three times a day (TID) | ORAL | Status: DC
  Administered 2017-05-01 – 2017-05-03 (×4): 80 mg via ORAL
  Filled 2017-05-01 (×4): qty 1

## 2017-05-01 MED ORDER — OXYCODONE-ACETAMINOPHEN 5-325 MG PO TABS: 2.0000 | ORAL_TABLET | ORAL | Status: DC | PRN

## 2017-05-01 MED ORDER — SIMETHICONE 80 MG PO CHEW: 80.0000 mg | CHEWABLE_TABLET | ORAL | Status: DC | PRN

## 2017-05-01 MED ORDER — KETOROLAC TROMETHAMINE 30 MG/ML IJ SOLN
30.0000 mg | Freq: Four times a day (QID) | INTRAMUSCULAR | Status: AC | PRN
  Administered 2017-05-01: 30 mg via INTRAMUSCULAR

## 2017-05-01 MED ORDER — OXYTOCIN 40 UNITS IN LACTATED RINGERS INFUSION - SIMPLE MED: 2.5000 [IU]/h | INTRAVENOUS | Status: AC

## 2017-05-01 MED ORDER — PHENYLEPHRINE 40 MCG/ML (10ML) SYRINGE FOR IV PUSH (FOR BLOOD PRESSURE SUPPORT)
PREFILLED_SYRINGE | INTRAVENOUS | Status: DC | PRN
  Administered 2017-05-01 (×2): 80 ug via INTRAVENOUS

## 2017-05-01 MED ORDER — OXYTOCIN 10 UNIT/ML IJ SOLN
INTRAMUSCULAR | Status: AC
  Filled 2017-05-01: qty 4

## 2017-05-01 MED ORDER — KETOROLAC TROMETHAMINE 30 MG/ML IJ SOLN
INTRAMUSCULAR | Status: AC
  Filled 2017-05-01: qty 1

## 2017-05-01 MED ORDER — ACETAMINOPHEN 325 MG PO TABS: 650.0000 mg | ORAL_TABLET | ORAL | Status: DC | PRN

## 2017-05-01 MED ORDER — DEXAMETHASONE SODIUM PHOSPHATE 10 MG/ML IJ SOLN
INTRAMUSCULAR | Status: DC | PRN
  Administered 2017-05-01: 4 mg via INTRAVENOUS

## 2017-05-01 MED ORDER — LACTATED RINGERS IV SOLN
INTRAVENOUS | Status: DC | PRN
  Administered 2017-05-01 (×2): via INTRAVENOUS

## 2017-05-01 MED ORDER — FENTANYL CITRATE (PF) 100 MCG/2ML IJ SOLN
INTRAMUSCULAR | Status: AC
  Filled 2017-05-01: qty 2

## 2017-05-01 MED ORDER — MENTHOL 3 MG MT LOZG: 1.0000 | LOZENGE | OROMUCOSAL | Status: DC | PRN

## 2017-05-01 MED ORDER — OXYCODONE-ACETAMINOPHEN 5-325 MG PO TABS
1.0000 | ORAL_TABLET | ORAL | Status: DC | PRN
  Administered 2017-05-03: 1 via ORAL
  Filled 2017-05-01: qty 1

## 2017-05-01 MED ORDER — PHENYLEPHRINE 8 MG IN D5W 100 ML (0.08MG/ML) PREMIX OPTIME
INJECTION | INTRAVENOUS | Status: DC | PRN
  Administered 2017-05-01: 60 ug/min via INTRAVENOUS

## 2017-05-01 MED ORDER — FENTANYL CITRATE (PF) 100 MCG/2ML IJ SOLN
INTRAMUSCULAR | Status: DC | PRN
  Administered 2017-05-01: 10 ug via INTRATHECAL

## 2017-05-01 MED ORDER — WITCH HAZEL-GLYCERIN EX PADS: 1.0000 "application " | MEDICATED_PAD | CUTANEOUS | Status: DC | PRN

## 2017-05-01 MED ORDER — PHENYLEPHRINE 8 MG IN D5W 100 ML (0.08MG/ML) PREMIX OPTIME
INJECTION | INTRAVENOUS | Status: AC
  Filled 2017-05-01: qty 100

## 2017-05-01 MED ORDER — PRENATAL MULTIVITAMIN CH
1.0000 | ORAL_TABLET | Freq: Every day | ORAL | Status: DC
  Administered 2017-05-02 – 2017-05-03 (×2): 1 via ORAL
  Filled 2017-05-01 (×2): qty 1

## 2017-05-01 MED ORDER — BUPIVACAINE IN DEXTROSE 0.75-8.25 % IT SOLN
INTRATHECAL | Status: DC | PRN
  Administered 2017-05-01: 1.6 mg via INTRATHECAL

## 2017-05-01 MED ORDER — CEFAZOLIN SODIUM-DEXTROSE 2-4 GM/100ML-% IV SOLN
2.0000 g | INTRAVENOUS | Status: AC
  Administered 2017-05-01: 2 g via INTRAVENOUS

## 2017-05-01 MED ORDER — LACTATED RINGERS IV SOLN
INTRAVENOUS | Status: DC
  Administered 2017-05-01: 15:00:00 via INTRAVENOUS

## 2017-05-01 MED ORDER — SENNOSIDES-DOCUSATE SODIUM 8.6-50 MG PO TABS
2.0000 | ORAL_TABLET | ORAL | Status: DC
  Administered 2017-05-01 – 2017-05-02 (×2): 2 via ORAL
  Filled 2017-05-01 (×2): qty 2

## 2017-05-01 MED ORDER — SODIUM CHLORIDE 0.9 % IR SOLN
Status: DC | PRN
  Administered 2017-05-01: 1000 mL

## 2017-05-01 MED ORDER — KETOROLAC TROMETHAMINE 30 MG/ML IJ SOLN: 30.0000 mg | Freq: Four times a day (QID) | INTRAMUSCULAR | Status: AC | PRN

## 2017-05-01 MED ORDER — MORPHINE SULFATE (PF) 0.5 MG/ML IJ SOLN
INTRAMUSCULAR | Status: DC | PRN
  Administered 2017-05-01: .2 mg via INTRATHECAL

## 2017-05-01 MED ORDER — DIBUCAINE 1 % RE OINT: 1.0000 "application " | TOPICAL_OINTMENT | RECTAL | Status: DC | PRN

## 2017-05-01 MED ORDER — MORPHINE SULFATE (PF) 0.5 MG/ML IJ SOLN
INTRAMUSCULAR | Status: AC
  Filled 2017-05-01: qty 10

## 2017-05-01 SURGICAL SUPPLY — 31 items
BENZOIN TINCTURE PRP APPL 2/3 (GAUZE/BANDAGES/DRESSINGS) ×3 IMPLANT
CHLORAPREP W/TINT 26ML (MISCELLANEOUS) ×3 IMPLANT
CLAMP CORD UMBIL (MISCELLANEOUS) ×3 IMPLANT
CLOSURE WOUND 1/2 X4 (GAUZE/BANDAGES/DRESSINGS) ×1
CLOTH BEACON ORANGE TIMEOUT ST (SAFETY) ×3 IMPLANT
DRSG OPSITE POSTOP 4X10 (GAUZE/BANDAGES/DRESSINGS) ×3 IMPLANT
ELECT REM PT RETURN 9FT ADLT (ELECTROSURGICAL) ×3
ELECTRODE REM PT RTRN 9FT ADLT (ELECTROSURGICAL) ×1 IMPLANT
GLOVE BIOGEL PI IND STRL 7.0 (GLOVE) ×2 IMPLANT
GLOVE BIOGEL PI IND STRL 7.5 (GLOVE) ×2 IMPLANT
GLOVE BIOGEL PI INDICATOR 7.0 (GLOVE) ×4
GLOVE BIOGEL PI INDICATOR 7.5 (GLOVE) ×4
GLOVE ECLIPSE 7.5 STRL STRAW (GLOVE) ×3 IMPLANT
GOWN STRL REUS W/TWL LRG LVL3 (GOWN DISPOSABLE) ×9 IMPLANT
NS IRRIG 1000ML POUR BTL (IV SOLUTION) ×3 IMPLANT
PACK C SECTION WH (CUSTOM PROCEDURE TRAY) ×3 IMPLANT
PAD ABD 8X7 1/2 STERILE (GAUZE/BANDAGES/DRESSINGS) ×3 IMPLANT
PAD OB MATERNITY 4.3X12.25 (PERSONAL CARE ITEMS) ×3 IMPLANT
PENCIL SMOKE EVAC W/HOLSTER (ELECTROSURGICAL) ×3 IMPLANT
RTRCTR C-SECT PINK 25CM LRG (MISCELLANEOUS) ×3 IMPLANT
SPONGE GAUZE 4X4 12PLY STER LF (GAUZE/BANDAGES/DRESSINGS) ×6 IMPLANT
STRIP CLOSURE SKIN 1/2X4 (GAUZE/BANDAGES/DRESSINGS) ×2 IMPLANT
SUT PDS AB 0 CTX 60 (SUTURE) ×3 IMPLANT
SUT VIC AB 0 CTX 36 (SUTURE) ×6
SUT VIC AB 0 CTX36XBRD ANBCTRL (SUTURE) ×3 IMPLANT
SUT VIC AB 2-0 CT1 27 (SUTURE) ×2
SUT VIC AB 2-0 CT1 TAPERPNT 27 (SUTURE) ×1 IMPLANT
SUT VIC AB 4-0 KS 27 (SUTURE) ×3 IMPLANT
TAPE CLOTH SURG 4X10 WHT LF (GAUZE/BANDAGES/DRESSINGS) ×3 IMPLANT
TOWEL OR 17X24 6PK STRL BLUE (TOWEL DISPOSABLE) ×3 IMPLANT
TRAY FOLEY BAG SILVER LF 14FR (SET/KITS/TRAYS/PACK) ×3 IMPLANT

## 2017-05-01 NOTE — Anesthesia Postprocedure Evaluation (Signed)
Anesthesia Post Note  Patient: Vanessa Molina  Procedure(s) Performed: Procedure(s) (LRB): REPEAT CESAREAN SECTION (N/A)     Patient location during evaluation: Mother Baby Anesthesia Type: Spinal Level of consciousness: awake and awake and alert Pain management: pain level controlled Vital Signs Assessment: post-procedure vital signs reviewed and stable Respiratory status: spontaneous breathing and nonlabored ventilation Cardiovascular status: stable Postop Assessment: no headache, patient able to bend at knees, no backache, no signs of nausea or vomiting, spinal receding and adequate PO intake Anesthetic complications: no    Last Vitals:  Vitals:   05/01/17 1212 05/01/17 1430  BP: 105/62 (!) 100/56  Pulse: 64 (!) 56  Resp: 16   Temp: 36.4 C 36.6 C    Last Pain:  Vitals:   05/01/17 1430  TempSrc: Oral  PainSc: 0-No pain   Pain Goal: Patients Stated Pain Goal: 4 (05/01/17 0801)               Laban EmperorMalinova,Ivelise Castillo Hristova

## 2017-05-01 NOTE — Lactation Note (Signed)
This note was copied from a baby's chart. Lactation Consultation Note  Patient Name: Vanessa Carolyne Fiscalmani Schwebach RUEAV'WToday's Date: 05/01/2017 Reason for consult: Initial assessment Breastfeeding consultation services and support information left with patient.  Mom states baby is going to breast easily.  Taught feeding cues and instructed to feed with any cue.  Baby is currently sleeping skin to skin on mom's chest. Encouraged to call out for assist prn.  Maternal Data    Feeding Feeding Type: Breast Fed  LATCH Score/Interventions Latch: Grasps breast easily, tongue down, lips flanged, rhythmical sucking. Intervention(s): Assist with latch;Adjust position  Audible Swallowing: A few with stimulation Intervention(s): Skin to skin  Type of Nipple: Everted at rest and after stimulation  Comfort (Breast/Nipple): Soft / non-tender     Hold (Positioning): Full assist, staff holds infant at breast Intervention(s): Support Pillows  LATCH Score: 7  Lactation Tools Discussed/Used     Consult Status Consult Status: Follow-up Date: 05/02/17 Follow-up type: In-patient    Huston FoleyMOULDEN, Bailen Geffre S 05/01/2017, 5:00 PM

## 2017-05-01 NOTE — Anesthesia Procedure Notes (Signed)
Spinal  Start time: 05/01/2017 9:30 AM End time: 05/01/2017 9:37 AM Staffing Anesthesiologist: [name], Vanessa Ion Performed: anesthesiologist  Preanesthetic Checklist Completed: patient identified, site marked, surgical consent, pre-op evaluation, timeout performed, IV checked, risks and benefits discussed and monitors and equipment checked Spinal Block Patient position: sitting Prep: site prepped and draped and DuraPrep Patient monitoring: heart rate, continuous pulse ox and blood pressure Approach: midline Location: L4-5 Injection technique: single-shot Needle Needle type: Pencan  Needle gauge: 24 G Needle length: 9 cm Needle insertion depth: 6 cm Assessment Sensory level: T4

## 2017-05-01 NOTE — Anesthesia Postprocedure Evaluation (Signed)
Anesthesia Post Note  Patient: Vanessa Molina  Procedure(s) Performed: Procedure(s) (LRB): REPEAT CESAREAN SECTION (N/A)     Patient location during evaluation: PACU Anesthesia Type: Spinal Level of consciousness: oriented and awake and alert Pain management: pain level controlled Vital Signs Assessment: post-procedure vital signs reviewed and stable Respiratory status: spontaneous breathing and respiratory function stable Cardiovascular status: blood pressure returned to baseline and stable Postop Assessment: no headache and no backache Anesthetic complications: no    Last Vitals:  Vitals:   05/01/17 1200 05/01/17 1212  BP: 98/64 105/62  Pulse: 61 64  Resp: 15 16  Temp: 36.6 C 36.4 C    Last Pain:  Vitals:   05/01/17 1212  TempSrc: Oral  PainSc:    Pain Goal: Patients Stated Pain Goal: 4 (05/01/17 0801)               Lowella CurbWarren Ray Tyron Manetta

## 2017-05-01 NOTE — Anesthesia Preprocedure Evaluation (Signed)
Anesthesia Evaluation  Patient identified by MRN, date of birth, ID band Patient awake    Reviewed: Allergy & Precautions, NPO status , Patient's Chart, lab work & pertinent test results  Airway Mallampati: II  TM Distance: >3 FB Neck ROM: Full    Dental no notable dental hx.    Pulmonary neg pulmonary ROS,    Pulmonary exam normal breath sounds clear to auscultation       Cardiovascular hypertension, negative cardio ROS Normal cardiovascular exam Rhythm:Regular Rate:Normal     Neuro/Psych negative neurological ROS  negative psych ROS   GI/Hepatic negative GI ROS, Neg liver ROS,   Endo/Other  negative endocrine ROS  Renal/GU negative Renal ROS  negative genitourinary   Musculoskeletal negative musculoskeletal ROS (+)   Abdominal   Peds negative pediatric ROS (+)  Hematology negative hematology ROS (+)   Anesthesia Other Findings   Reproductive/Obstetrics negative OB ROS (+) Pregnancy                             Anesthesia Physical Anesthesia Plan  ASA: II  Anesthesia Plan: Epidural   Post-op Pain Management:    Induction:   PONV Risk Score and Plan: 3 and Ondansetron and Treatment may vary due to age or medical condition  Airway Management Planned: Natural Airway  Additional Equipment:   Intra-op Plan:   Post-operative Plan:   Informed Consent: I have reviewed the patients History and Physical, chart, labs and discussed the procedure including the risks, benefits and alternatives for the proposed anesthesia with the patient or authorized representative who has indicated his/her understanding and acceptance.   Dental advisory given  Plan Discussed with: CRNA  Anesthesia Plan Comments:         Anesthesia Quick Evaluation

## 2017-05-01 NOTE — Transfer of Care (Signed)
Immediate Anesthesia Transfer of Care Note  Patient: Vanessa Molina  Procedure(s) Performed: Procedure(s): REPEAT CESAREAN SECTION (N/A)  Patient Location: PACU  Anesthesia Type:Spinal  Level of Consciousness: awake, alert , oriented and patient cooperative  Airway & Oxygen Therapy: Patient Spontanous Breathing  Post-op Assessment: Report given to RN and Post -op Vital signs reviewed and stable  Post vital signs: Reviewed and stable  Last Vitals:  Vitals:   05/01/17 0801  BP: 111/73  Pulse: 73  Resp: 18  Temp: 36.9 C    Last Pain:  Vitals:   05/01/17 0801  TempSrc: Oral  PainSc: 0-No pain      Patients Stated Pain Goal: 4 (05/01/17 0801)  Complications: No apparent anesthesia complications

## 2017-05-01 NOTE — Addendum Note (Signed)
Addendum  created 05/01/17 1559 by Elgie CongoMalinova, Estes Lehner H, CRNA   Sign clinical note

## 2017-05-01 NOTE — Op Note (Signed)
Vanessa Molina PROCEDURE DATE: 05/01/2017  PREOPERATIVE DIAGNOSIS: Intrauterine pregnancy at  2944w0d weeks gestation; elective repeat  POSTOPERATIVE DIAGNOSIS: The same  PROCEDURE: Repeat Low Transverse Cesarean Section  SURGEON:  Dr. Candelaria CelesteJacob Lashanda Storlie  ASSISTANT: Shelbie ProctorMatt, Wallman, PA-S  INDICATIONS: Vanessa Molina is a 31 y.o. [id number] at 7344w0d scheduled for cesarean section secondary to pevious cesarean section.  The risks of cesarean section discussed with the patient included but were not limited to: bleeding which may require transfusion or reoperation; infection which may require antibiotics; injury to bowel, bladder, ureters or other surrounding organs; injury to the fetus; need for additional procedures including hysterectomy in the event of a life-threatening hemorrhage; placental abnormalities wth subsequent pregnancies, incisional problems, thromboembolic phenomenon and other postoperative/anesthesia complications. The patient concurred with the proposed plan, giving informed written consent for the procedure.    FINDINGS:  Viable female infant in vertex presentation.  Apgars 8 and 9, weight pending.  Clear amniotic fluid.  Intact placenta, three vessel cord.  Normal uterus, fallopian tubes and ovaries bilaterally.  ANESTHESIA:    Spinal INTRAVENOUS FLUIDS:2000 ml ESTIMATED BLOOD LOSS: 1200 ml URINE OUTPUT:  1000 ml SPECIMENS: Placenta sent to L&D COMPLICATIONS: None immediate  PROCEDURE IN DETAIL:  The patient received intravenous antibiotics and had sequential compression devices applied to her lower extremities while in the preoperative area.  She was then taken to the operating room where spinal anesthesia was administered and was found to be adequate. She was then placed in a dorsal supine position with a leftward tilt, and prepped and draped in a sterile manner.  A foley catheter was placed into her bladder and attached to constant gravity, which drained clear fluid throughout.  After an  adequate timeout was performed, a Pfannenstiel skin incision was made with scalpel and carried through to the underlying layer of fascia. The fascia was incised in the midline and this incision was extended bilaterally using the Mayo scissors. Kocher clamps were applied to the superior aspect of the fascial incision and the underlying rectus muscles were dissected off bluntly. A similar process was carried out on the inferior aspect of the facial incision. The rectus muscles were separated in the midline bluntly and the peritoneum was entered bluntly. An Alexis retractor was placed to aid in visualization of the uterus.  Attention was turned to the lower uterine segment where a transverse hysterotomy was made with a scalpel and extended bilaterally bluntly. The infant was successfully delivered, and cord was clamped and cut and infant was handed over to awaiting neonatology team. Uterine massage was then administered and the placenta delivered intact with three-vessel cord. The uterus was then cleared of clot and debris.  The hysterotomy was closed with 0 Vicryl in a running locked fashion, and an imbricating layer was also placed with a 0 Vicryl. Overall, excellent hemostasis was noted. The abdomen and the pelvis were cleared of all clot and debris and the Jon Gillslexis was removed. Hemostasis was confirmed on all surfaces.  The peritoneum was reapproximated using 2-0 vicryl running stitches. The fascia was then closed using 0 Vicryl in a running fashion. The skin was closed with 4-0 vicryl. The patient tolerated the procedure well. Sponge, lap, instrument and needle counts were correct x 2. She was taken to the recovery room in stable condition.    Vanessa Molina, Vanessa Dibiasio J, DO 05/01/2017 10:53 AM

## 2017-05-01 NOTE — H&P (Signed)
Faculty Practice H&P  Vanessa Molina is a 31 y.o. female [phone] with IUP at [redacted]w[redacted]d presenting for repeat cesarean section for prior cesarean. Pregnancy was been complicated by h/o severe pre-eclampsia, h/o IUFD, language barrier..    Pt states she has been having no contractions, no vaginal bleeding, intact membranes, with normal fetal movement.     Prenatal Course Source of Care: [facility] with onset of care at 10 weeks  Pregnancy complications or risks: Patient Active Problem List   Diagnosis Date Noted  . Anemia affecting pregnancy in third trimester 02/14/2017  . Pregnancy with history of neonatal death 21-Nov-2016  . Maternal varicella, non-immune 10/24/2016  . Delivery with history of cesarean section 10/12/2016  . History of severe pre-eclampsia 10/12/2016  . Language barrier 10/12/2016  . Supervision of high risk pregnancy, antepartum 10/11/2016  . 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  She plans to breastfeed  Prenatal labs and studies: ABO, Rh: --/--/O POS (06/25 1035) Antibody: NEG (06/25 1035) Rubella: !Error! RPR: Non Reactive (06/25 1035)  HBsAg: Negative (12/07 1235)  HIV: Non Reactive (04/09 1240)  GBS: Negative (05/30 1639)  2hr Glucola: negative Genetic screening: NIPS: normal Anatomy US: normal  Past Medical History:  Past Medical History:  Diagnosis Date  . Headaches, cluster   . History of pre-eclampsia in prior pregnancy, currently pregnant   . Pregnancy induced hypertension     Past Surgical History:  Past Surgical History:  Procedure Laterality Date  . CESAREAN SECTION     x2    Obstetrical History:  OB History    Gravida Para Term Preterm AB Living   4 2 1 1 1 1    SAB TAB Ectopic Multiple Live Births   1 0 0 0 2      Gynecological History:  OB History    Gravida Para Term Preterm AB Living   4 2 1 1 1 1    SAB TAB Ectopic Multiple Live Births   1 0 0 0 2      Social History:  Social History    Social History  . Marital status: Married    Spouse name: N/A  . Number of children: N/A  . Years of education: N/A   Social History Main Topics  . Smoking status: Never Smoker  . Smokeless tobacco: Never Used  . Alcohol use No  . Drug use: No  . Sexual activity: Yes    Birth control/ protection: None   Other Topics Concern  . None   Social History Narrative   ** Merged History Encounter **        Family History:  Family History  Problem Relation Age of Onset  . Diabetes Maternal Aunt   . Diabetes Maternal Uncle   . Hypertension Paternal Aunt   . Hypertension Paternal Uncle     Medications:  Prenatal vitamins,  Current Facility-Administered Medications  Medication Dose Route Frequency Provider Last Rate Last Dose  . ceFAZolin (ANCEF) IVPB 2g/100 mL premix  2 g Intravenous On Call to OR Levie Heritage, DO      . lactated ringers infusion  125 mL/hr Intravenous Continuous Levie Heritage, DO 125 mL/hr at 05/01/17 0836 125 mL/hr at 05/01/17 6213    Allergies: No Known Allergies  Review of Systems: - negative  Physical Exam: Blood pressure 111/73, pulse 73, temperature 98.4 F (36.9 C), temperature source Oral, resp. rate 18, height 5\' 4"  (1.626 m), weight 175 lb (79.4 kg), last menstrual period  08/01/2016. GENERAL: Well-developed, well-nourished female in no acute distress.  LUNGS: Clear to auscultation bilaterally.  HEART: Regular rate and rhythm. ABDOMEN: Soft, nontender, nondistended, gravid. EFW 7.5 lbs EXTREMITIES: Nontender, no edema, 2+ distal pulses. FHT:  Baseline rate 150 bpm       Pertinent Labs/Studies:   Lab Results  Component Value Date   WBC 4.6 04/30/2017   HGB 10.9 (L) 04/30/2017   HCT 33.1 (L) 04/30/2017   MCV 84.4 04/30/2017   PLT 207 04/30/2017    Assessment : Vanessa Molina is a 31 y.o. [id number] at 9633w0d being admitted for cesarean section secondary to repeat cesarean  Plan: The risks of cesarean section discussed with the patient  included but were not limited to: bleeding which may require transfusion or reoperation; infection which may require antibiotics; injury to bowel, bladder, ureters or other surrounding organs; injury to the fetus; need for additional procedures including hysterectomy in the event of a life-threatening hemorrhage; placental abnormalities wth subsequent pregnancies, incisional problems, thromboembolic phenomenon and other postoperative/anesthesia complications. The patient concurred with the proposed plan, giving informed written consent for the procedure.   Patient has been NPO since last night and will remain NPO for procedure.  Preoperative prophylactic Ancef ordered on call to the OR.    Levie HeritageStinson, Khaza Blansett J, DO 05/01/2017, 8:46 AM

## 2017-05-02 LAB — CBC
HCT: 29.1 % — ABNORMAL LOW (ref 36.0–46.0)
Hemoglobin: 9.8 g/dL — ABNORMAL LOW (ref 12.0–15.0)
MCH: 28.1 pg (ref 26.0–34.0)
MCHC: 33.7 g/dL (ref 30.0–36.0)
MCV: 83.4 fL (ref 78.0–100.0)
PLATELETS: 170 10*3/uL (ref 150–400)
RBC: 3.49 MIL/uL — AB (ref 3.87–5.11)
RDW: 14.2 % (ref 11.5–15.5)
WBC: 9.4 10*3/uL (ref 4.0–10.5)

## 2017-05-02 LAB — BIRTH TISSUE RECOVERY COLLECTION (PLACENTA DONATION)

## 2017-05-02 MED ORDER — NALBUPHINE HCL 10 MG/ML IJ SOLN: 5.0000 mg | INTRAMUSCULAR | Status: DC | PRN

## 2017-05-02 MED ORDER — FERROUS SULFATE 325 (65 FE) MG PO TABS
325.0000 mg | ORAL_TABLET | Freq: Three times a day (TID) | ORAL | Status: DC
  Administered 2017-05-02 – 2017-05-03 (×4): 325 mg via ORAL
  Filled 2017-05-02 (×4): qty 1

## 2017-05-02 MED ORDER — DIPHENHYDRAMINE HCL 50 MG/ML IJ SOLN: 12.5000 mg | INTRAMUSCULAR | Status: DC | PRN

## 2017-05-02 MED ORDER — NALOXONE HCL 0.4 MG/ML IJ SOLN: 0.4000 mg | INTRAMUSCULAR | Status: DC | PRN

## 2017-05-02 MED ORDER — ONDANSETRON HCL 4 MG/2ML IJ SOLN: 4.0000 mg | Freq: Three times a day (TID) | INTRAMUSCULAR | Status: DC | PRN

## 2017-05-02 MED ORDER — NALBUPHINE HCL 10 MG/ML IJ SOLN: 5.0000 mg | Freq: Once | INTRAMUSCULAR | Status: DC | PRN

## 2017-05-02 MED ORDER — DIPHENHYDRAMINE HCL 25 MG PO CAPS: 25.0000 mg | ORAL_CAPSULE | ORAL | Status: DC | PRN

## 2017-05-02 MED ORDER — SODIUM CHLORIDE 0.9% FLUSH: 3.0000 mL | INTRAVENOUS | Status: DC | PRN

## 2017-05-02 MED ORDER — NALOXONE HCL 2 MG/2ML IJ SOSY: 1.0000 ug/kg/h | PREFILLED_SYRINGE | INTRAMUSCULAR | Status: DC | PRN

## 2017-05-02 MED ORDER — SCOPOLAMINE 1 MG/3DAYS TD PT72: 1.0000 | MEDICATED_PATCH | Freq: Once | TRANSDERMAL | Status: DC

## 2017-05-02 NOTE — Progress Notes (Signed)
Subjective: Postpartum Day #1: RLTCS: Cesarean Delivery Patient reports incisional pain and tolerating PO.    Objective: Vital signs in last 24 hours: Temp:  [97.5 F (36.4 C)-98.7 F (37.1 C)] 98.4 F (36.9 C) (06/27 0500) Pulse Rate:  [56-70] 61 (06/27 0500) Resp:  [15-21] 18 (06/27 0500) BP: (92-108)/(52-69) 106/59 (06/27 0500) SpO2:  [96 %-100 %] 99 % (06/27 0500)  Physical Exam:  General: alert, cooperative and no distress Lochia: appropriate Uterine Fundus: firm Incision: no significant drainage, no dehiscence, no significant erythema, pressure dressing on.  DVT Evaluation: No evidence of DVT seen on physical exam. No cords or calf tenderness. No significant calf/ankle edema.   Recent Labs  04/30/17 1035 05/02/17 0602  HGB 10.9* 9.8*  HCT 33.1* 29.1*    Assessment/Plan: Status post Cesarean section. Doing well postoperatively.  Continue current care.   Iron for anemia.  Plan for discharge home 05/03/17.    Vanessa Molina, CNM 05/02/2017, 8:22 AM

## 2017-05-02 NOTE — Progress Notes (Signed)
Used an Sports coachArabic audio interpreter to order the patient's lunch, assess pain, explain 24hr newborn screenings, and to explain how to record the baby's feedings.

## 2017-05-03 MED ORDER — IBUPROFEN 600 MG PO TABS: 600.0000 mg | ORAL_TABLET | Freq: Four times a day (QID) | ORAL | 2 refills | Status: DC

## 2017-05-03 MED ORDER — SENNOSIDES-DOCUSATE SODIUM 8.6-50 MG PO TABS: 2.0000 | ORAL_TABLET | ORAL | 2 refills | Status: DC

## 2017-05-03 MED ORDER — OXYCODONE-ACETAMINOPHEN 5-325 MG PO TABS: 1.0000 | ORAL_TABLET | ORAL | 0 refills | Status: DC | PRN

## 2017-05-03 NOTE — Progress Notes (Signed)
Subjective: Postpartum Day #2: Cesarean Delivery Patient reports incisional pain, tolerating PO, + flatus and no problems voiding.    Objective: Vital signs in last 24 hours: Temp:  [97.9 F (36.6 C)-98.4 F (36.9 C)] 97.9 F (36.6 C) (06/28 0524) Pulse Rate:  [61-76] 61 (06/28 0524) Resp:  [18] 18 (06/28 0524) BP: (98-114)/(57-75) 114/75 (06/28 0524)  Physical Exam:  General: alert, cooperative and no distress Lochia: appropriate Uterine Fundus: firm Incision: no significant drainage, no dehiscence, no significant erythema DVT Evaluation: No evidence of DVT seen on physical exam. No cords or calf tenderness. No significant calf/ankle edema.   Recent Labs  04/30/17 1035 05/02/17 0602  HGB 10.9* 9.8*  HCT 33.1* 29.1*    Assessment/Plan: Status post Cesarean section. Doing well postoperatively.  Discharge home with standard precautions and return to clinic in 4 weeks.  Roe Coombsachelle A Halena Mohar, CNM 05/03/2017, 7:18 AM

## 2017-05-03 NOTE — Lactation Note (Signed)
This note was copied from a baby's chart. Lactation Consultation Note Baby cluster feeding. Was spitty for a day after delivery. Only 2 emesis documented. Baby at 39 hrs hrs old had 6 stools, 5 voids. Had 7% weight loss at 19 hrs old. Since then mom is supplementing w/formula. Formula supplementing sheet reviewed. Gave baby 30ml after last feeding. Encouraged to burp half way of amount to give. FOB stated he understood, teach back correct. FOB at bedside very supportive. Understands what LC is saying Dexter in rm. If has any questions or doesn't understand what LC is saying. FOB stated he understood.  Noted in baby's mouth has a wide space notch in center of upper gum line. Has good coordinated suck on gloved finger. Moms nipples intact at this time.   Patient Name: Girl Carolyne Fiscalmani Soderman QIONG'EToday's Date: 05/03/2017 Reason for consult: Follow-up assessment   Maternal Data    Feeding Feeding Type: Formula Nipple Type: Slow - flow Length of feed: 15 min  LATCH Score/Interventions Latch: Repeated attempts needed to sustain latch, nipple held in mouth throughout feeding, stimulation needed to elicit sucking reflex. Intervention(s): Adjust position;Breast compression;Breast massage  Audible Swallowing: A few with stimulation  Type of Nipple: Everted at rest and after stimulation  Comfort (Breast/Nipple): Soft / non-tender     Hold (Positioning): Assistance needed to correctly position infant at breast and maintain latch. Intervention(s): Position options;Support Pillows;Skin to skin  LATCH Score: 7  Lactation Tools Discussed/Used     Consult Status Consult Status: Follow-up Date: 05/03/17 (in pm) Follow-up type: In-patient    Jaydian Santana, Diamond NickelLAURA G 05/03/2017, 1:19 AM

## 2017-05-03 NOTE — Progress Notes (Signed)
CSW received consult for a score of 9 on the [place] Depression Screen.  CSW met with MOB and FOB to offer support and complete assessment.   Parents declined offer to utilize Arabic interpreter to converse.  MOB answered questions in English to some degree and FOB interpreted for her when she did not understand.  Both parents were very pleasant and welcoming.  MOB reports feeling "happy" now and during pregnancy.  She denies any current concerns regarding emotional/mental heath.  She denies any symptoms of PMADs after her other child's birth, however, did not discuss her neonatal loss.  Parents report that they have a great support system of family living in the same community.  FOB states "we are all one big family."   CSW provided education regarding perinatal mood disorders and encouraged MOB to contact a medical professional if symptoms are noted at any time.  They plan to follow up at [facility].  CSW informed them of support available at their pediatrician's office as well as OB office and again asked MOB to speak up if negative emotions are interfering with daily life.  Parents agreed.  CSW identifies no further need for intervention and no barriers to discharge at this time.

## 2017-05-03 NOTE — Discharge Summary (Signed)
OB Discharge Summary     Patient Name: Vanessa Molina DOB: 10-11-1986 MRN: [id number]  Date of admission: 05/01/2017 Delivering MD: Levie Heritage   Date of discharge: 05/03/2017  Admitting diagnosis: RCS Intrauterine pregnancy: [redacted]w[redacted]d     Secondary diagnosis:  Active Problems:   Status post cesarean delivery  Additional problems: Hx of Severe Pre-E, Language barrier, varicella non-immune, hx of IUFD, Anemia     Discharge diagnosis: Term Pregnancy Delivered and RLTCS                                                                                                Post partum procedures:none  Augmentation: none  Complications: None  Hospital course:  Sceduled C/S   31 y.o. yo [id number] at [redacted]w[redacted]d was admitted to the hospital 05/01/2017 for scheduled cesarean section with the following indication:Prior Uterine Surgery.  Membrane Rupture Time/Date: 9:58 AM ,05/01/2017   Patient delivered a Viable infant.05/01/2017  Details of operation can be found in separate operative note.  [name] had an uncomplicated postpartum course.  She is ambulating, tolerating a regular diet, passing flatus, and urinating well. Patient is discharged home in stable condition on  05/03/17         Physical exam  Vitals:   05/02/17 0500 05/02/17 0919 05/02/17 1842 05/03/17 0524  BP: (!) 106/59 98/62 (!) 105/57 114/75  Pulse: 61 63 76 61  Resp: 18 18 18 18   Temp: 98.4 F (36.9 C) 98.2 F (36.8 C) 98.4 F (36.9 C) 97.9 F (36.6 C)  TempSrc: Oral Oral Oral Oral  SpO2: 99%     Weight:      Height:       General: alert, cooperative and no distress Lochia: appropriate Uterine Fundus: firm Incision: Dressing is clean, dry, and intact DVT Evaluation: No evidence of DVT seen on physical exam. No cords or calf tenderness. No significant calf/ankle edema. Labs: Lab Results  Component Value Date   WBC 9.4 05/02/2017   HGB 9.8 (L) 05/02/2017   HCT 29.1 (L) 05/02/2017   MCV 83.4 05/02/2017   PLT 170 05/02/2017    CMP Latest Ref Rng & Units 02/12/2017  Glucose 65 - 99 mg/dL -  BUN 6 - 20 mg/dL -  Creatinine 1.47 - 8.29 mg/dL 5.62(Z)  Sodium 308 - 657 mmol/L -  Potassium 3.5 - 5.1 mmol/L -  Chloride 101 - 111 mmol/L -  CO2 22 - 32 mmol/L -  Calcium 8.9 - 10.3 mg/dL -  Total Protein 6.5 - 8.1 g/dL -  Total Bilirubin 0.3 - 1.2 mg/dL -  Alkaline Phos 38 - 846 U/L -  AST 0 - 40 IU/L 16  ALT 0 - 32 IU/L 12    Discharge instruction: per After Visit Summary and "Baby and Me Booklet".  After visit meds:  Allergies as of 05/03/2017   No Known Allergies     Medication List    STOP taking these medications   omeprazole 20 MG capsule Commonly known as:  PRILOSEC     TAKE these medications   acetaminophen 325 MG tablet Commonly known as:  TYLENOL Take 650 mg by mouth every 6 (six) hours as needed for headache.   CITRANATAL BLOOM 90-1 MG Tabs Take 1 tablet by mouth daily.   ibuprofen 600 MG tablet Commonly known as:  ADVIL,MOTRIN Take 1 tablet (600 mg total) by mouth every 6 (six) hours.   oxyCODONE-acetaminophen 5-325 MG tablet Commonly known as:  PERCOCET/ROXICET Take 1 tablet by mouth every 4 (four) hours as needed (pain scale 4-7).   senna-docusate 8.6-50 MG tablet Commonly known as:  Senokot-S Take 2 tablets by mouth daily. Start taking on:  05/04/2017       Diet: routine diet  Activity: Advance as tolerated. Pelvic rest for 6 weeks.   Outpatient follow up:4 weeks Follow up Appt:No future appointments. Follow up Visit:No Follow-up on file.  Postpartum contraception: Condoms  Newborn Data: Live born female  Birth Weight: 7 lb 2.3 oz (3240 g) APGAR: 8, 9  Baby Feeding: Breast Disposition:home with mother   05/03/2017 Roe Coombsachelle A Sakoya Win, CNM

## 2017-05-03 NOTE — Progress Notes (Signed)
Discharge instructions and education completed through Arabic interpreter. Patient denies questions at this time.

## 2017-05-06 ENCOUNTER — Encounter (HOSPITAL_COMMUNITY): Payer: Self-pay | Admitting: *Deleted

## 2017-05-06 ENCOUNTER — Inpatient Hospital Stay (HOSPITAL_COMMUNITY)
Admission: AD | Admit: 2017-05-06 | Discharge: 2017-05-06 | Disposition: A | Discharge status: Home / Self Care | Payer: Medicaid Other | Source: Ambulatory Visit | Attending: Obstetrics & Gynecology | Admitting: Obstetrics & Gynecology

## 2017-05-06 DIAGNOSIS — O9089 Other complications of the puerperium, not elsewhere classified: Secondary | ICD-10-CM | POA: Diagnosis present

## 2017-05-06 DIAGNOSIS — Z711 Person with feared health complaint in whom no diagnosis is made: Secondary | ICD-10-CM

## 2017-05-06 DIAGNOSIS — Z4889 Encounter for other specified surgical aftercare: Secondary | ICD-10-CM

## 2017-05-06 LAB — URINALYSIS, ROUTINE W REFLEX MICROSCOPIC
BILIRUBIN URINE: NEGATIVE
Glucose, UA: NEGATIVE mg/dL
Ketones, ur: NEGATIVE mg/dL
LEUKOCYTES UA: NEGATIVE
Nitrite: NEGATIVE
PROTEIN: NEGATIVE mg/dL
SPECIFIC GRAVITY, URINE: 1.018 (ref 1.005–1.030)
pH: 5 (ref 5.0–8.0)

## 2017-05-06 NOTE — MAU Provider Note (Signed)
Chief Complaint: Drainage from Incision   SUBJECTIVE HPI: Vanessa Molina is a 31 y.o. [id number] at Unknown who presents to Maternity Admissions reporting incision is leaking.  Patient is 5 days post-C-section. Patient was at home and had showered and after her shower she notes her honeycomb dressing was completely soaked. She was concerned that her incision may be leaking. She did not remove the dressing. She came straight to MAU with her husband and baby. She denies any fevers or chills or any overt abdominal pain aside from her post surgery pain she has been having, that has improved daily. She states she has not noticed any redness on the skin surrounding the incision site.    Past Medical History:  Diagnosis Date  . Headaches, cluster   . History of pre-eclampsia in prior pregnancy, currently pregnant   . Pregnancy induced hypertension    OB History  Gravida Para Term Preterm AB Living  4 2 1 1 1 1   SAB TAB Ectopic Multiple Live Births  1 0 0 0 2    # Outcome Date GA Lbr Len/2nd Weight Sex Delivery Anes PTL Lv  4 Gravida           3 SAB 09/07/15          2 Term 01/26/11 [redacted]w[redacted]d   M CS-LTranv   LIV     Complications: PIH (pregnancy induced hypertension)  1 Preterm 12/15/07 [redacted]w[redacted]d    CS-LTranv   DEC     Past Surgical History:  Procedure Laterality Date  . CESAREAN SECTION     x2  . CESAREAN SECTION N/A 05/01/2017   Procedure: REPEAT CESAREAN SECTION;  Surgeon: Levie Heritage, DO;  Location: [facility] BIRTHING SUITES;  Service: Obstetrics;  Laterality: N/A;   Social History   Social History  . Marital status: Married    Spouse name: N/A  . Number of children: N/A  . Years of education: N/A   Occupational History  . Not on file.   Social History Main Topics  . Smoking status: Never Smoker  . Smokeless tobacco: Never Used  . Alcohol use No  . Drug use: No  . Sexual activity: Yes    Birth control/ protection: None   Other Topics Concern  . Not on file   Social History  Narrative   ** Merged History Encounter **       No current facility-administered medications on file prior to encounter.    Current Outpatient Prescriptions on File Prior to Encounter  Medication Sig Dispense Refill  . acetaminophen (TYLENOL) 325 MG tablet Take 650 mg by mouth every 6 (six) hours as needed for headache.    . ibuprofen (ADVIL,MOTRIN) 600 MG tablet Take 1 tablet (600 mg total) by mouth every 6 (six) hours. 120 tablet 2  . oxyCODONE-acetaminophen (PERCOCET/ROXICET) 5-325 MG tablet Take 1 tablet by mouth every 4 (four) hours as needed (pain scale 4-7). 40 tablet 0  . Prenatal-DSS-FeCb-FeGl-FA (CITRANATAL BLOOM) 90-1 MG TABS Take 1 tablet by mouth daily. 30 tablet 12  . senna-docusate (SENOKOT-S) 8.6-50 MG tablet Take 2 tablets by mouth daily. 60 tablet 2   No Known Allergies  I have reviewed the past Medical Hx, Surgical Hx, Social Hx, Allergies and Medications.   REVIEW OF SYSTEMS  A comprehensive ROS was negative except per HPI.   OBJECTIVE Patient Vitals for the past 24 hrs:  BP Temp Pulse Resp Weight  05/06/17 1412 121/71 98.1 F (36.7 C) 62 18 158 lb (71.7 kg)  PHYSICAL EXAM Constitutional: Well-developed, well-nourished female in no acute distress.  Cardiovascular: normal rate Respiratory: normal rate and effort.  GI: Abd soft, non-tender, non-distended. Pos BS x 4 Incision: Honeycomb dressing was moist, removed. The steri strips overlying incision were completely dry, with old blood noted. Steri-Strips are removed. There was no signs of dehiscence of the incision site. There was no leakage or drainage of the incision, including when expression was performed. No erythema surrounding the incision site. Normal granulation tissue noted. Incision looks appropriately healed. MS: Extremities nontender, no edema, normal ROM Neurologic: Alert and oriented x 4.     LAB RESULTS Results for orders placed or performed during the hospital encounter of 05/06/17 (from  the past 24 hour(s))  Urinalysis, Routine w reflex microscopic     Status: Abnormal   Collection Time: 05/06/17  2:00 PM  Result Value Ref Range   Color, Urine YELLOW YELLOW   APPearance CLEAR CLEAR   Specific Gravity, Urine 1.018 1.005 - 1.030   pH 5.0 5.0 - 8.0   Glucose, UA NEGATIVE NEGATIVE mg/dL   Hgb urine dipstick LARGE (A) NEGATIVE   Bilirubin Urine NEGATIVE NEGATIVE   Ketones, ur NEGATIVE NEGATIVE mg/dL   Protein, ur NEGATIVE NEGATIVE mg/dL   Nitrite NEGATIVE NEGATIVE   Leukocytes, UA NEGATIVE NEGATIVE   RBC / HPF 6-30 0 - 5 RBC/hpf   WBC, UA 0-5 0 - 5 WBC/hpf   Bacteria, UA RARE (A) NONE SEEN   Squamous Epithelial / LPF 0-5 (A) NONE SEEN   Mucous PRESENT     IMAGING [place] Mfm Fetal Bpp Wo Non Stress  Result Date: 04/17/2017 ----------------------------------------------------------------------  OBSTETRICS REPORT                      (Signed Final 04/17/2017 09:30 am) ---------------------------------------------------------------------- Patient Info  ID #:       [id number]                          D.O.B.:  September 07, 1986 (31 yrs)  Name:       Vanessa Molina                      Visit Date: 04/16/2017 04:43 pm ---------------------------------------------------------------------- Performed By  Performed By:     Emeline Darling BS,      Secondary Phy.:   RACHELLE A                    RDMS                                                             [facility] CNM  Attending:        Darlyn Read MD         Address:          [phone]  [place]                                                             [address]  Referred By:      [facility]      Location:         [facility]                    for [employer] [facility])  Ref. Address:     [address]                    [address]                     [place]                    [address] ---------------------------------------------------------------------- Orders   #  Description                                 Code   1  [place] MFM FETAL BPP WO NON STRESS              42595.63  ----------------------------------------------------------------------   #  Ordered By               Order #        Accession #    Episode #   1  PEGGY CONSTANT           [id number]      [id number]     [id number]  ---------------------------------------------------------------------- Indications   [redacted] weeks gestation of pregnancy                Z3A.36   Poor obstetric history: Previous neonatal      O09.299   death (33 week in [place]); delivery d/t   severe pre-E   History of cesarean delivery x 2, currently    O34.219   pregnant   Poor obstetric history: Previous               O09.299   preeclampsia / eclampsia/gestational HTN   on ASA   Non-reactive NST                               O28.9  ---------------------------------------------------------------------- OB History  Blood Type:            Height:  5'3"   Weight (lb):  153       BMI:  27.1  Gravidity:    4         Term:   1        Prem:   1        SAB:   1  TOP:          0       Ectopic:  0        Living: 1 ---------------------------------------------------------------------- Fetal Evaluation  Num  Of Fetuses:     1  Fetal Heart         158  Rate(bpm):  Cardiac Activity:   Observed  Presentation:       Cephalic  Amniotic Fluid  AFI FV:      Subjectively within normal limits  AFI Sum(cm)     %Tile       Largest Pocket(cm)  21.31           82          7.15  RUQ(cm)       RLQ(cm)       LUQ(cm)        LLQ(cm)  4.36          4.85          7.15           4.95 ---------------------------------------------------------------------- Biophysical Evaluation  Amniotic F.V:   Pocket => 2 cm two         F. Tone:        Observed                  planes  F. Movement:    Observed                   Score:          8/8  F. Breathing:   Observed  ---------------------------------------------------------------------- Gestational Age  LMP:           36w 6d        Date:  08/01/16                 EDD:   05/08/17  Best:          36w 6d     Det. By:  LMP  (08/01/16)          EDD:   05/08/17 ---------------------------------------------------------------------- Impression  Single living intrauterine pregnancy at [redacted]w[redacted]d.  Cephalic presentation.  Normal amniotic fluid volume.  BPP 8/8. ---------------------------------------------------------------------- Recommendations  Please correlate BPP score with fetal tracing / NST. ----------------------------------------------------------------------                   Darlyn Read, MD Electronically Signed Final Report   04/17/2017 09:30 am ----------------------------------------------------------------------  [place] Mfm Fetal Bpp Wo Non Stress  Result Date: 04/12/2017 ----------------------------------------------------------------------  OBSTETRICS REPORT                        (Corrected Final 04/12/2017 04:36                                                                          pm) ---------------------------------------------------------------------- Patient Info  ID #:       [id number]                          D.O.B.:  [birth year] (31 yrs)  Name:       Lina Sar Osborn                      Visit Date: 04/12/2017 03:28 pm ---------------------------------------------------------------------- Performed By  Performed By:     Fayne Norrie BS,  Secondary Phy.:   RACHELLE A                    RDMS, RVT                                                             [facility] CNM  Attending:        Charlsie Merles MD         Address:          [address]                                                             [place]                                                             [address]  Referred By:      [facility]      Location:         [facility]                    for [facility])  Ref. Address:     [address]                    [address]                    [place]                    [address] ---------------------------------------------------------------------- Orders   #  Description                                 Code   1  [place] MFM FETAL BPP WO NON STRESS              86578.46  ----------------------------------------------------------------------   #  Ordered By               Order #        Accession #    Episode #   1  Wynelle Bourgeois           [id number]      [id number]     [id number]  ----------------------------------------------------------------------  Indications   [redacted] weeks gestation of pregnancy                Z3A.36   Poor obstetric history: Previous neonatal      O09.299   death (33 week in [place]); delivery d/t   severe pre-E   History of cesarean delivery x 2, currently    O34.219   pregnant   Poor obstetric history: Previous               O09.299   preeclampsia / eclampsia/gestational HTN   on ASA  ---------------------------------------------------------------------- OB History  Blood Type:            Height:  5'3"   Weight (lb):  153       BMI:  27.1  Gravidity:    4         Term:   1        Prem:   1        SAB:   1  TOP:          0       Ectopic:  0        Living: 1 ---------------------------------------------------------------------- Fetal Evaluation  Num Of Fetuses:     1  Fetal Heart         154  Rate(bpm):  Cardiac Activity:   Observed  Presentation:       Cephalic  Placenta:           Anterior, above cervical os  P. Cord Insertion:  Previously Visualized  Amniotic Fluid  AFI FV:      Subjectively within normal limits  AFI Sum(cm)     %Tile       Largest Pocket(cm)  18.14           68          5.84  RUQ(cm)       RLQ(cm)       LUQ(cm)        LLQ(cm)  4.52          3.19          5.84           4.59  ---------------------------------------------------------------------- Biophysical Evaluation  Amniotic F.V:   Pocket => 2 cm two         F. Tone:        Observed                  planes  F. Movement:    Observed                   Score:          6/8  F. Breathing:   Not Observed ---------------------------------------------------------------------- Gestational Age  LMP:           36w 2d        Date:  08/01/16                 EDD:   05/08/17  Best:          Stevie Kern 2d     Det. By:  LMP  (08/01/16)          EDD:   05/08/17 ---------------------------------------------------------------------- Impression  Single IUP at 36w 2d  Hx of 33 week delivery with neonatal death (in [place])  Cephalic presentation  BPP 6/8 (-2 for absent breathing)  Normal amniotic fluid volume ---------------------------------------------------------------------- Recommendations  NST in MAU was nonreactive. would consider prolonged  monitoring ----------------------------------------------------------------------                      Charlsie Merles, MD Electronically Signed Corrected Final Report  04/12/2017 04:36 pm ----------------------------------------------------------------------  [place] Mfm Fetal Bpp Wo Non Stress  Result Date: 04/11/2017 ----------------------------------------------------------------------  OBSTETRICS REPORT                        (Corrected Final 04/11/2017 03:14                                                                          pm) ---------------------------------------------------------------------- Patient Info  ID #:       [id number]                         D.O.B.:   31-Mar-1986 (31 yrs)  Name:       Vanessa Molina                     Visit Date:  04/09/2017 12:51 pm ---------------------------------------------------------------------- Performed By  Performed By:     Percell Boston          Secondary Phy.:   RACHELLE A                    RDMS                                                             [facility] CNM   Attending:        Candis Shine        Address:          [address]                    [place]                                                             [address]                                                             [place]                                                             [address]  Referred By:      [facility]      Location:  [facility]                    for [employer] [employer] [facility])  Ref. Address:     [address]                    [address]                    [place]                    [address] ---------------------------------------------------------------------- Orders   #  Description                                 Code   1  [place] MFM FETAL BPP WO NON STRESS              [phone]  ----------------------------------------------------------------------   #  Ordered By               Order #        Accession #    Episode #   1  RACHELLE Marjo Bicker          [id number]      [id number]     [id number]  ---------------------------------------------------------------------- Indications   [redacted] weeks gestation of pregnancy                Z3A.35   Poor obstetric history: Previous neonatal      O09.299   death (33 week in [place]); delivery d/t   severe pre-E   History of cesarean delivery x 2, currently    O34.219   pregnant   Poor obstetric history: Previous               O09.299   preeclampsia / eclampsia/gestational HTN   on ASA   Non-reactive NST                               O28.9  ---------------------------------------------------------------------- OB History  Blood Type:            Height:  5'3"   Weight (lb):  153      BMI:   27.1  Gravidity:    4         Term:   1        Prem:   1        SAB:   1  TOP:          0       Ectopic:  0        Living: 1 ---------------------------------------------------------------------- Fetal Evaluation  Num Of Fetuses:     1  Fetal Heart         148  Rate(bpm):  Cardiac Activity:   Observed   Presentation:       Cephalic  Amniotic Fluid  AFI FV:      Subjectively within normal limits  AFI Sum(cm)     %Tile       Largest Pocket(cm)  11.5            33  4.64  RUQ(cm)       RLQ(cm)       LUQ(cm)        LLQ(cm)  4.64          3.6           3.26           0 ---------------------------------------------------------------------- Biophysical Evaluation  Amniotic F.V:   Within normal limits       F. Tone:        Observed  F. Movement:    Observed                   Score:          6/8  F. Breathing:   Not Observed ---------------------------------------------------------------------- Gestational Age  LMP:           35w 6d       Date:   08/01/16                 EDD:   05/08/17  Best:          Consuello Closs35w 6d    Det. By:   LMP  (08/01/16)          EDD:   05/08/17 ---------------------------------------------------------------------- Impression  Single IUP at 35w 6d  Cephalic presentation  BPP 6/8 (-2 for absent breathing)  Normal amniotic fluid volume ---------------------------------------------------------------------- Recommendations  Please coorelate BPP score with fetal tracing / NST ----------------------------------------------------------------------               Fayne Norriearolyn Isley, BS, RDMS, RVT Electronically Signed Corrected Final Report  04/11/2017 03:14 pm ----------------------------------------------------------------------  [place]ore[place] Mfm Fetal Bpp Wo Non Stress  Result Date: 04/11/2017 ----------------------------------------------------------------------  OBSTETRICS REPORT                        (Corrected Final 04/11/2017 03:13                                                                          pm) ---------------------------------------------------------------------- Patient Info  ID #:       [id number]                         D.O.B.:   Jan 25, 1986 (31 yrs)  Name:       Vanessa FiscalAMANI Aurich                     Visit Date:  04/10/2017 04:11 pm ----------------------------------------------------------------------  Performed By  Performed By:     Tomma Lightningevin Vics             Secondary Phy.:   RACHELLE A                    RDMS,RVT                                                             [facility] CNM  Attending:        Candis ShinePaul W Whitecar  Address:          [address]                    [place]                                                             [address]                                                             [place]                                                             [address]  Referred By:      [facility]      Location:         [facility]                    for [facility])  Ref. Address:     [address]                    [address]                    [place]                    [address] ---------------------------------------------------------------------- Orders   #  Description                                 Code   1  [place] MFM FETAL BPP WO NON STRESS              08657.84  ----------------------------------------------------------------------   #  Ordered By               Order #        Accession #    Episode #   1  RACHELLE Marjo Bicker          [id number]      [id number]     [id number]  ---------------------------------------------------------------------- Indications   [redacted] weeks gestation of pregnancy                Z3A.36   Poor obstetric history: Previous neonatal      O09.299   death (33 week in [place]); delivery d/t   severe pre-E   History of cesarean delivery x 2, currently    O34.219   pregnant   Poor obstetric history: Previous               O09.299  preeclampsia / eclampsia/gestational HTN   on ASA  ---------------------------------------------------------------------- OB History  Blood Type:            Height:  5'3"   Weight (lb):  153      BMI:   27.1  Gravidity:    4         Term:   1        Prem:   1        SAB:   1  TOP:          0       Ectopic:  0        Living: 1  ---------------------------------------------------------------------- Fetal Evaluation  Num Of Fetuses:     1  Fetal Heart         131  Rate(bpm):  Cardiac Activity:   Observed  Presentation:       Cephalic  Placenta:           Anterior, above cervical os  Amniotic Fluid  AFI FV:      Subjectively within normal limits  AFI Sum(cm)     %Tile       Largest Pocket(cm)  16.1            59          4.68  RUQ(cm)       RLQ(cm)       LUQ(cm)        LLQ(cm)  3.77          3.53          4.12           4.68 ---------------------------------------------------------------------- Biophysical Evaluation  Amniotic F.V:   Within normal limits       F. Tone:        Observed  F. Movement:    Observed                   Score:          6/8  F. Breathing:   Not Observed ---------------------------------------------------------------------- Gestational Age  LMP:           36w 0d       Date:   08/01/16                 EDD:   05/08/17  Best:          Stevie Kern 0d    Det. By:   LMP  (08/01/16)          EDD:   05/08/17 ---------------------------------------------------------------------- Anatomy  Stomach:               Appears normal, left   Bladder:                Appears normal                         sided  Kidneys:               Appear normal ---------------------------------------------------------------------- Cervix Uterus Adnexa  Cervix  Not visualized (advanced GA >29wks) ---------------------------------------------------------------------- Impression  Single IUP at 36w 0d  Hx of 33 week delivery with neonatal death (in [place])  Cephalic presentation  BPP 6/8 (-2 for absent breathing)  Normal amniotic fluid volume ---------------------------------------------------------------------- Recommendations  Please correlate BPP wit fetal tracing. ----------------------------------------------------------------------               Fayne Norrie, BS, RDMS, RVT Electronically Signed Corrected Final  Report  04/11/2017 03:13 pm  ----------------------------------------------------------------------   MAU COURSE Incision check VSS  MDM Plan of care reviewed with patient, including labs and tests ordered and medical treatment. Reassured patient and husband that the incision is healing well and that there is no signs of leakage or dehiscence of the wound. Warning signs given for signs of infection and return precautions given. Routine follow-up with OB/GYN.   ASSESSMENT 1. Encounter for post surgical wound check   2. Physically well but worried     PLAN Discharge home in stable condition. Incision maintenance given Return precautions given.  Follow-up Information    [facility]. Schedule an appointment as soon as possible for a visit in 4 week(s).   Specialty:  Obstetrics and Gynecology Why:  Post delivery follow up and care Contact information: [address] [place] [address] [phone]         Allergies as of 05/06/2017   No Known Allergies     Medication List    TAKE these medications   acetaminophen 325 MG tablet Commonly known as:  TYLENOL Take 650 mg by mouth every 6 (six) hours as needed for headache.   CITRANATAL BLOOM 90-1 MG Tabs Take 1 tablet by mouth daily.   ibuprofen 600 MG tablet Commonly known as:  ADVIL,MOTRIN Take 1 tablet (600 mg total) by mouth every 6 (six) hours.   oxyCODONE-acetaminophen 5-325 MG tablet Commonly known as:  PERCOCET/ROXICET Take 1 tablet by mouth every 4 (four) hours as needed (pain scale 4-7).   senna-docusate 8.6-50 MG tablet Commonly known as:  Senokot-S Take 2 tablets by mouth daily.        Jen Mow, DO OB Fellow 05/06/2017 3:51 PM

## 2017-05-06 NOTE — MAU Note (Signed)
Pt presents to MAU with complaints of incision leaking. Pt had a C/S on June the 26th. Denies any pain

## 2017-05-06 NOTE — Discharge Instructions (Signed)
Incision Care, Adult °An incision is a cut that a doctor makes in your skin for surgery (for a procedure). Most times, these cuts are closed after surgery. Your cut from surgery may be closed with stitches (sutures), staples, skin glue, or skin tape (adhesive strips). You may need to return to your doctor to have stitches or staples taken out. This may happen many days or many weeks after your surgery. The cut needs to be well cared for so it does not get infected. °How to care for your cut °Cut care °· Follow instructions from your doctor about how to take care of your cut. Make sure you: °? Wash your hands with soap and water before you change your bandage (dressing). If you cannot use soap and water, use hand sanitizer. °? Change your bandage as told by your doctor. °? Leave stitches, skin glue, or skin tape in place. They may need to stay in place for 2 weeks or longer. If tape strips get loose and curl up, you may trim the loose edges. Do not remove tape strips completely unless your doctor says it is okay. °· Check your cut area every day for signs of infection. Check for: °? More redness, swelling, or pain. °? More fluid or blood. °? Warmth. °? Pus or a bad smell. °· Ask your doctor how to clean the cut. This may include: °? Using mild soap and water. °? Using a clean towel to pat the cut dry after you clean it. °? Putting a cream or ointment on the cut. Do this only as told by your doctor. °? Covering the cut with a clean bandage. °· Ask your doctor when you can leave the cut uncovered. °· Do not take baths, swim, or use a hot tub until your doctor says it is okay. Ask your doctor if you can take showers. You may only be allowed to take sponge baths for bathing. °Medicines °· If you were prescribed an antibiotic medicine, cream, or ointment, take the antibiotic or put it on the cut as told by your doctor. Do not stop taking or putting on the antibiotic even if your condition gets better. °· Take  over-the-counter and prescription medicines only as told by your doctor. °General instructions °· Limit movement around your cut. This helps healing. °? Avoid straining, lifting, or exercise for the first month, or for as long as told by your doctor. °? Follow instructions from your doctor about going back to your normal activities. °? Ask your doctor what activities are safe. °· Protect your cut from the sun when you are outside for the first 6 months, or for as long as told by your doctor. Put on sunscreen around the scar or cover up the scar. °· Keep all follow-up visits as told by your doctor. This is important. °Contact a doctor if: °· Your have more redness, swelling, or pain around the cut. °· You have more fluid or blood coming from the cut. °· Your cut feels warm to the touch. °· You have pus or a bad smell coming from the cut. °· You have a fever or shaking chills. °· You feel sick to your stomach (nauseous) or you throw up (vomit). °· You are dizzy. °· Your stitches or staples come undone. °Get help right away if: °· You have a red streak coming from your cut. °· Your cut bleeds through the bandage and the bleeding does not stop with gentle pressure. °· The edges of your cut open   up and separate. °· You have very bad (severe) pain. °· You have a rash. °· You are confused. °· You pass out (faint). °· You have trouble breathing and you have a fast heartbeat. °This information is not intended to replace advice given to you by your health care provider. Make sure you discuss any questions you have with your health care provider. °Document Released: 01/15/2012 Document Revised: 06/30/2016 Document Reviewed: 06/30/2016 °Elsevier Interactive Patient Education © 2017 Elsevier Inc. ° °

## 2017-05-29 ENCOUNTER — Ambulatory Visit (INDEPENDENT_AMBULATORY_CARE_PROVIDER_SITE_OTHER): Payer: Medicaid Other | Admitting: Certified Nurse Midwife

## 2017-05-29 ENCOUNTER — Encounter: Payer: Self-pay | Admitting: Certified Nurse Midwife

## 2017-05-29 NOTE — Progress Notes (Signed)
Post Partum Exam  Vanessa Molina is a 31 y.o. [phone] female who presents for a postpartum visit. She is 4 weeks postpartum following a low cervical transverse Cesarean section. I have fully reviewed the prenatal and intrapartum course. The delivery was at 39 gestational weeks.  Anesthesia: spinal. Postpartum course has been doing well. Baby's course has been doing well. Baby is feeding by breast. Bleeding staining only. Bowel function is normal. Bladder function is normal. Patient is not sexually active. Contraception method is condoms.  Postpartum depression screening:neg  The following portions of the patient's history were reviewed and updated as appropriate: allergies, current medications, past family history, past medical history, past social history, past surgical history and problem list.  Review of Systems Pertinent items noted in HPI and remainder of comprehensive ROS otherwise negative.    Objective:  unknown if currently breastfeeding.  General:  alert, cooperative and no distress   Breasts:  inspection negative, no nipple discharge or bleeding, no masses or nodularity palpable  Lungs: clear to auscultation bilaterally  Heart:  regular rate and rhythm, S1, S2 normal, no murmur, click, rub or gallop  Abdomen: soft, non-tender; bowel sounds normal; no masses,  no organomegaly, C-section wound: well approximated, no s/s infection, no tenderness, healed  Pelvic Exam: Not performed.        Assessment:    Normal 4 postpartum exam. Pap smear not done at today's visit.  Pap smear 10/12/16: normal  S/P LTCS Plan:   1. Contraception: condoms 2. Follow up in: 5 months or as needed.

## 2019-01-01 IMAGING — US US MFM OB FOLLOW-UP
1 series · 14 of 28 positions shown · non-contrast
Comparison: none

[Series 1: us mfm ob follow-up · 45 acquisitions, 14 frames shown]
[im 2/45]
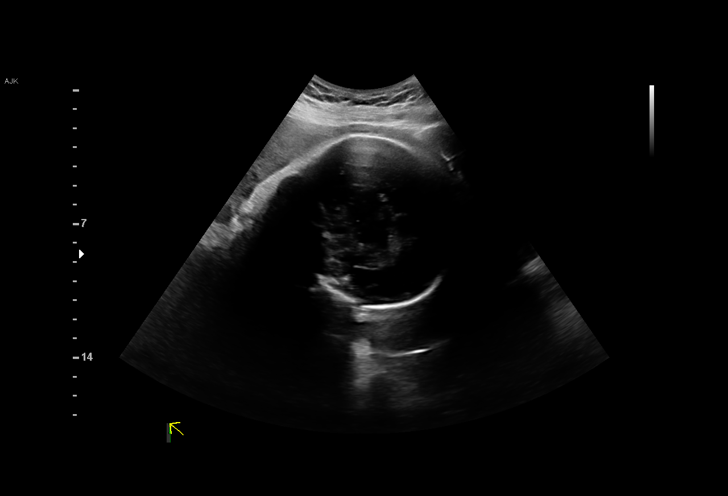
[im 5/45]
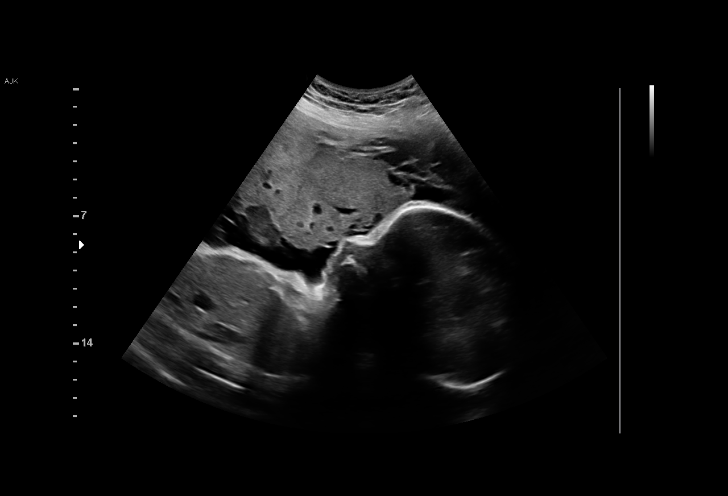
[im 9/45]
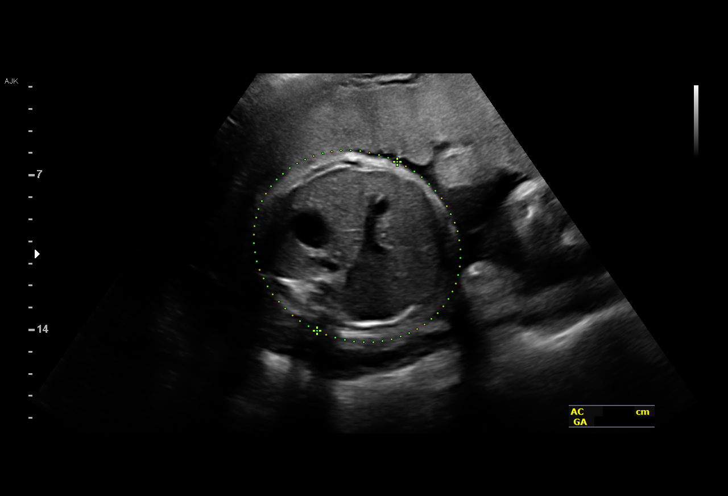
[im 12/45]
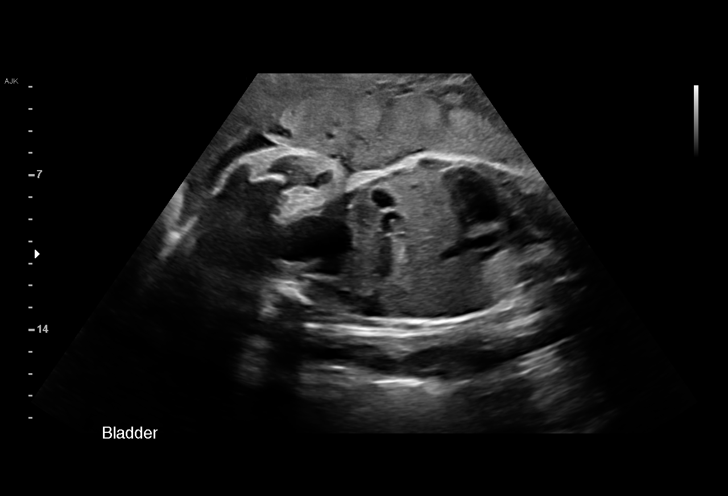
[im 15/45]
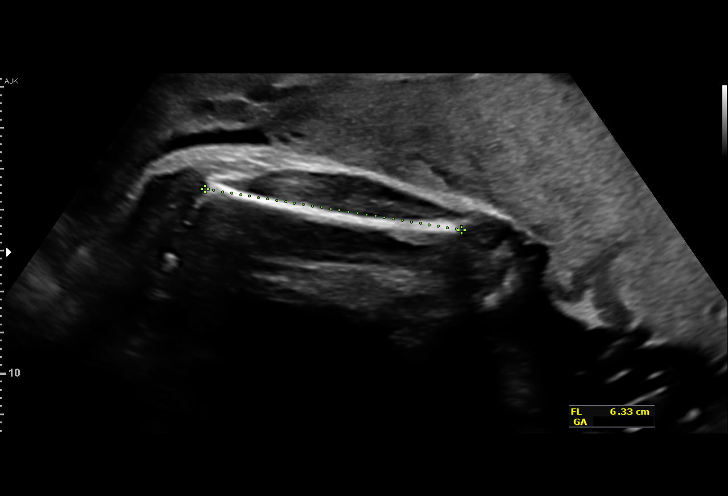
[im 18/45]
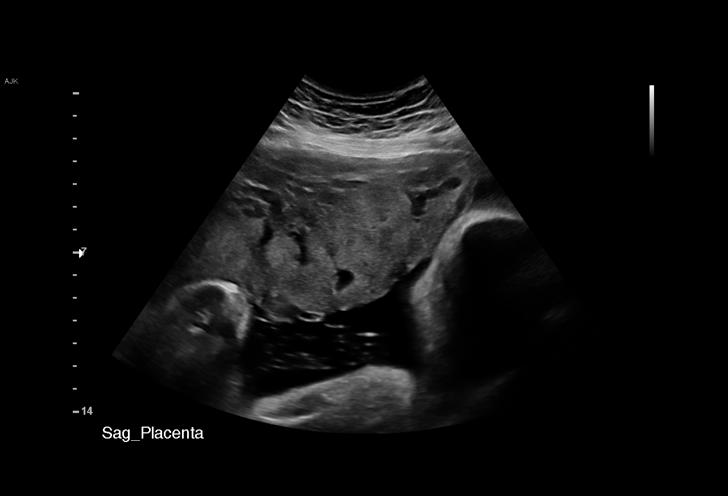
[im 22/45]
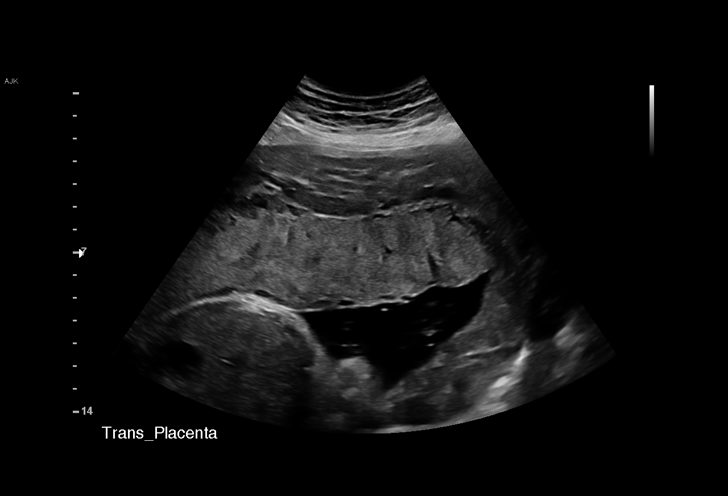
[im 25/45]
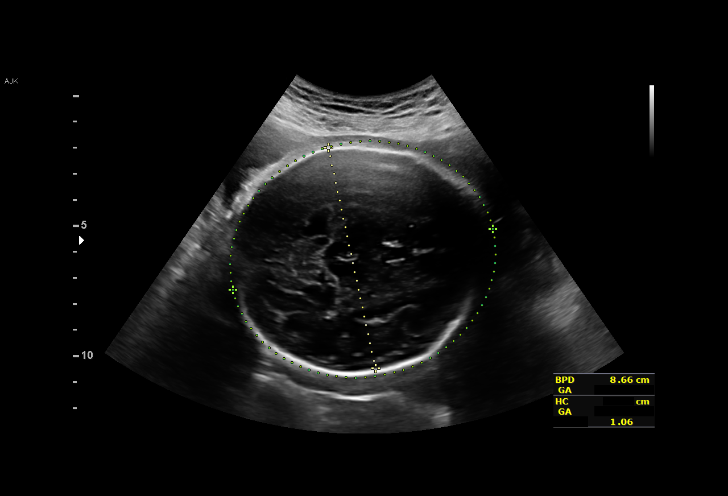
[im 28/45]
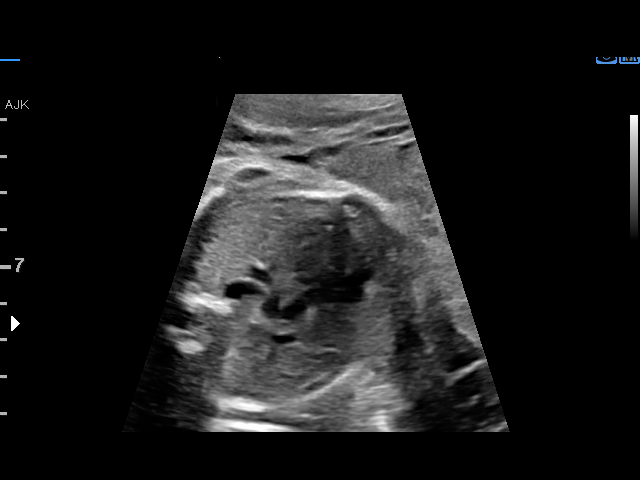
[im 31/45]
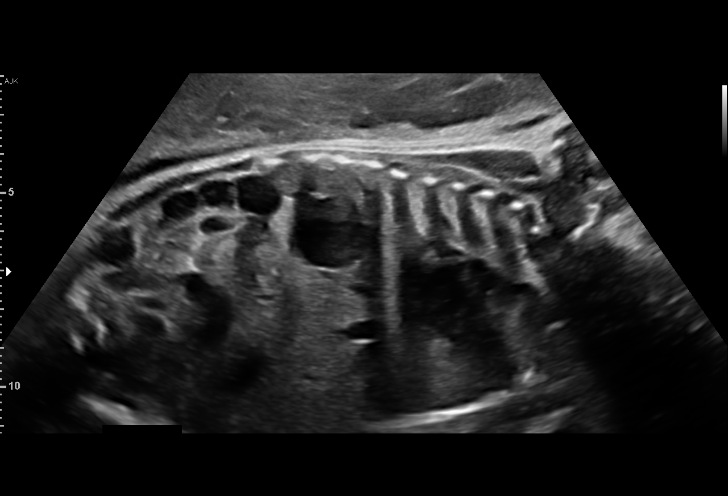
[im 35/45]
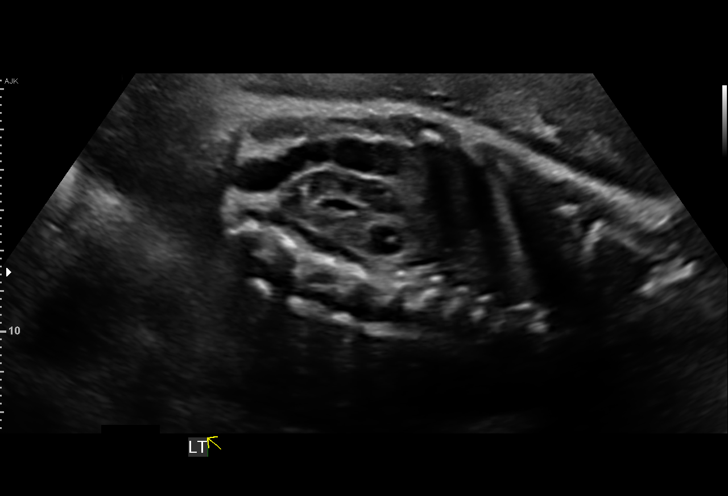
[im 38/45]
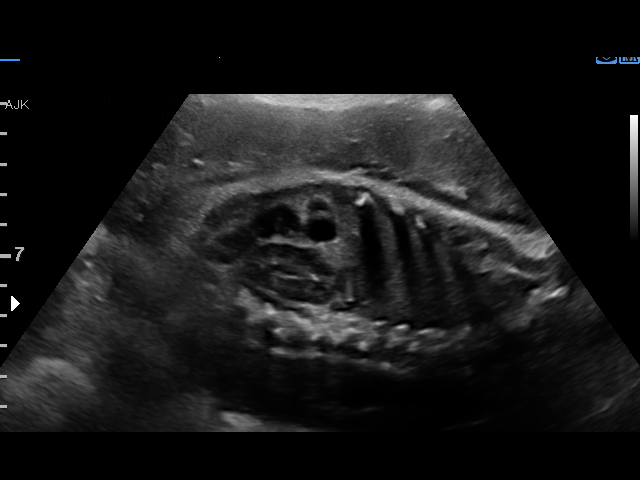
[im 41/45]
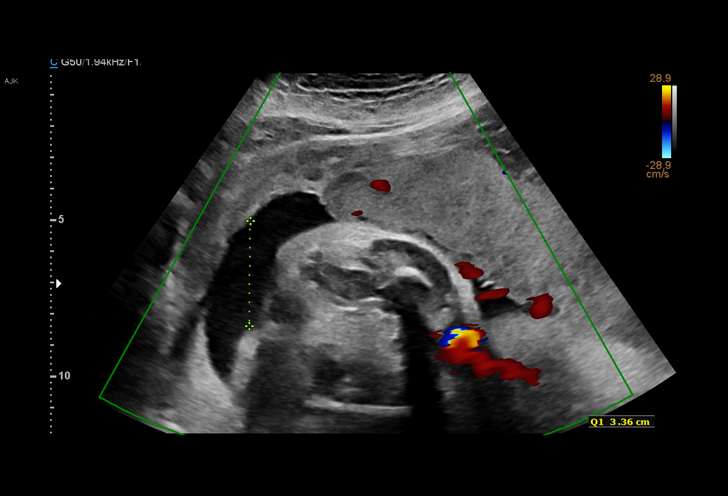
[im 45/45]
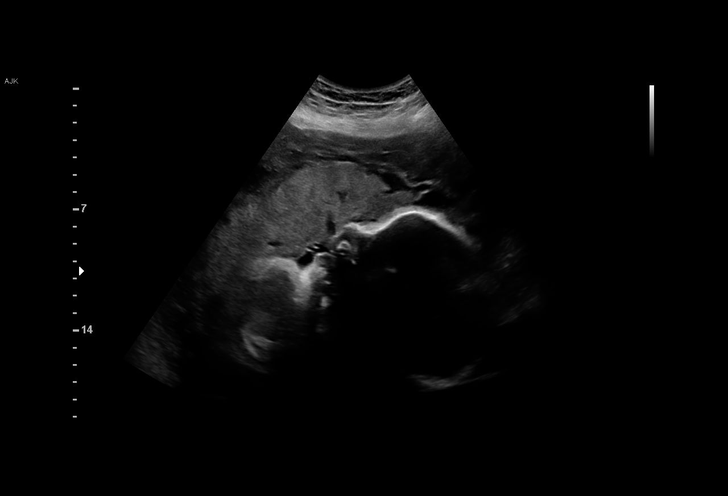

[14 of 28 positions shown; findings below may reference images not displayed]

Performed By:     [facility]          Secondary Phy.:   [REDACTED] [HOSPITAL]
for [REDACTED]care ([HOSPITAL])

1  SAPIA NOSA            [id number]      [id number]     [id number]
Indications

32 weeks gestation of pregnancy
Poor obstetric history: Previous neonatal
death (33 week in [place]); delivery d/t
severe pre-E
History of cesarean delivery x 2, currently
pregnant
Poor obstetric history: Previous
preeclampsia / eclampsia/gestational HTN
on ASA
OB History

Blood Type:            Height:  5'3"   Weight (lb):  153      BMI:
Gravidity:    4         Term:   1        Prem:   1        SAB:   1
TOP:          0       Ectopic:  0        Living: 1
Fetal Evaluation

Num Of Fetuses:     1
Fetal Heart         136
Rate(bpm):
Cardiac Activity:   Observed
Presentation:       Cephalic
Placenta:           Anterior, above cervical os
P. Cord Insertion:  Previously Visualized
Amniotic Fluid
AFI FV:      Subjectively within normal limits

AFI Sum(cm)     %Tile       Largest Pocket(cm)
12.66           38

RUQ(cm)       RLQ(cm)       LUQ(cm)        LLQ(cm)
3.36
Biometry

BPD:        87  mm     G. Age:  35w 1d         94  %    CI:        81.35   %   70 - 86
FL/HC:      20.5   %   19.9 -
HC:      304.5  mm     G. Age:  33w 6d         39  %    HC/AC:      1.07       0.96 -
AC:      283.5  mm     G. Age:  32w 2d         37  %    FL/BPD:     71.7   %   71 - 87
FL:       62.4  mm     G. Age:  32w 2d         24  %    FL/AC:      22.0   %   20 - 24

Est. FW:    8696  gm      4 lb 8 oz     55  %
Gestational Age

LMP:           32w 6d       Date:   08/01/16                 EDD:   05/08/17
U/S Today:     33w 3d                                        EDD:   05/04/17
Best:          32w 6d    Det. By:   LMP  (08/01/16)          EDD:   05/08/17
Anatomy

Cranium:               Appears normal         Aortic Arch:            Previously seen
Cavum:                 Appears normal         Ductal Arch:            Previously seen
Ventricles:            Appears normal         Diaphragm:              Appears normal
Choroid Plexus:        Previously seen        Stomach:                Appears normal, left
sided
Cerebellum:            Previously seen        Abdomen:                Previously seen
Posterior Fossa:       Previously seen        Abdominal Wall:         Previously seen
Nuchal Fold:           Previously seen        Cord Vessels:           Previously seen
Face:                  Orbits and profile     Kidneys:                Appear normal
previously seen
Lips:                  Previously seen        Bladder:                Appears normal
Thoracic:              Appears normal         Spine:                  Previously seen
Heart:                 Appears normal         Upper Extremities:      Previously seen
(4CH, axis, and situs
RVOT:                  Appears normal         Lower Extremities:      Previously seen
LVOT:                  Appears normal

Other:  Fetus appears to be a female. Heels and 5th digit prev.visualized.
Nasal bone prev. visualized.
Cervix Uterus Adnexa

Cervix
Not visualized (advanced GA >72wks)

Uterus
No abnormality visualized.

Left Ovary
Not visualized.

Right Ovary
Not visualized.

Adnexa:       No abnormality visualized.
Impression

Singleton intrauterine pregnancy at 32+6 weeks. Previous
pregnancy complicated by severe preeclampsia with neonatal
death
Interval review of the anatomy shows no sonographic
markers for aneuploidy or structural anomalies
Amniotic fluid volume is normal
Estimated fetal weight is 2041g which is growth in the 55th
percentile. Abdominal circumfrence is in the 37th percentile
Recommendations

Normal growth and development. no further vists are
necessary in MFM. would recommend antepartum testing
from now until delivery

## 2019-09-30 ENCOUNTER — Encounter: Payer: Self-pay | Admitting: Obstetrics

## 2019-09-30 ENCOUNTER — Other Ambulatory Visit: Payer: Self-pay

## 2019-09-30 ENCOUNTER — Ambulatory Visit (INDEPENDENT_AMBULATORY_CARE_PROVIDER_SITE_OTHER): Payer: Self-pay

## 2019-09-30 DIAGNOSIS — Z3200 Encounter for pregnancy test, result unknown: Secondary | ICD-10-CM

## 2019-09-30 DIAGNOSIS — Z3201 Encounter for pregnancy test, result positive: Secondary | ICD-10-CM

## 2019-09-30 LAB — POCT URINE PREGNANCY: Preg Test, Ur: POSITIVE — AB

## 2019-09-30 NOTE — Progress Notes (Signed)
..   Vanessa Molina presents today for UPT. She has no unusual complaints. LMP: 08-19-19     OBJECTIVE: Appears well, in no apparent distress.  OB History    Gravida  5   Para  3   Term  2   Preterm  1   AB  1   Living  2     SAB  1   TAB  0   Ectopic  0   Multiple  0   Live Births  3          Home UPT Result:Positive In-Office UPT result:Positive I have reviewed the patient's medical, obstetrical, social, and family histories, and medications.   ASSESSMENT: Positive pregnancy test  PLAN Prenatal care to be completed at: [facility]

## 2019-09-30 NOTE — Progress Notes (Signed)
Agree with A & P. 

## 2019-11-11 ENCOUNTER — Other Ambulatory Visit (HOSPITAL_COMMUNITY)
Admission: RE | Admit: 2019-11-11 | Discharge: 2019-11-11 | Disposition: A | Discharge status: Home / Self Care | Payer: Medicaid Other | Source: Ambulatory Visit | Attending: Obstetrics & Gynecology | Admitting: Obstetrics & Gynecology

## 2019-11-11 ENCOUNTER — Other Ambulatory Visit: Payer: Self-pay

## 2019-11-11 ENCOUNTER — Ambulatory Visit (INDEPENDENT_AMBULATORY_CARE_PROVIDER_SITE_OTHER): Payer: Medicaid Other | Admitting: Obstetrics & Gynecology

## 2019-11-11 ENCOUNTER — Encounter: Payer: Self-pay | Admitting: Obstetrics & Gynecology

## 2019-11-11 VITALS — BP 103/67 | HR 80 | Wt 147.9 lb

## 2019-11-11 DIAGNOSIS — Z3481 Encounter for supervision of other normal pregnancy, first trimester: Secondary | ICD-10-CM

## 2019-11-11 DIAGNOSIS — O0990 Supervision of high risk pregnancy, unspecified, unspecified trimester: Secondary | ICD-10-CM | POA: Diagnosis not present

## 2019-11-11 DIAGNOSIS — Z3A12 12 weeks gestation of pregnancy: Secondary | ICD-10-CM

## 2019-11-11 DIAGNOSIS — O0991 Supervision of high risk pregnancy, unspecified, first trimester: Secondary | ICD-10-CM

## 2019-11-11 DIAGNOSIS — O34219 Maternal care for unspecified type scar from previous cesarean delivery: Secondary | ICD-10-CM

## 2019-11-11 MED ORDER — BLOOD PRESSURE KIT DEVI: 1.0000 | 0 refills | Status: DC

## 2019-11-11 NOTE — Patient Instructions (Signed)

## 2019-11-11 NOTE — Progress Notes (Signed)
Pt is here for initial prenatal visit. LMP 08/19/19.

## 2019-11-12 LAB — OBSTETRIC PANEL, INCLUDING HIV
Antibody Screen: NEGATIVE
Basophils Absolute: 0 10*3/uL (ref 0.0–0.2)
Basos: 0 %
EOS (ABSOLUTE): 0.1 10*3/uL (ref 0.0–0.4)
Eos: 1 %
HIV Screen 4th Generation wRfx: NONREACTIVE
Hematocrit: 34.6 % (ref 34.0–46.6)
Hemoglobin: 11.5 g/dL (ref 11.1–15.9)
Hepatitis B Surface Ag: NEGATIVE
Immature Grans (Abs): 0 10*3/uL (ref 0.0–0.1)
Immature Granulocytes: 0 %
Lymphocytes Absolute: 1.9 10*3/uL (ref 0.7–3.1)
Lymphs: 35 %
MCH: 26.2 pg — ABNORMAL LOW (ref 26.6–33.0)
MCHC: 33.2 g/dL (ref 31.5–35.7)
MCV: 79 fL (ref 79–97)
Monocytes Absolute: 0.4 10*3/uL (ref 0.1–0.9)
Monocytes: 7 %
Neutrophils Absolute: 3.1 10*3/uL (ref 1.4–7.0)
Neutrophils: 57 %
Platelets: 338 10*3/uL (ref 150–450)
RBC: 4.39 x10E6/uL (ref 3.77–5.28)
RDW: 13.9 % (ref 11.7–15.4)
RPR Ser Ql: NONREACTIVE
Rh Factor: POSITIVE
Rubella Antibodies, IGG: 14.1 index (ref >0.99)
WBC: 5.5 10*3/uL (ref 3.4–10.8)

## 2019-11-12 LAB — CERVICOVAGINAL ANCILLARY ONLY
Bacterial Vaginitis (gardnerella): NEGATIVE
Candida Glabrata: NEGATIVE
Candida Vaginitis: NEGATIVE
Chlamydia: NEGATIVE
Comment: NEGATIVE
Comment: NEGATIVE
Comment: NEGATIVE
Comment: NEGATIVE
Comment: NEGATIVE
Comment: NORMAL
Neisseria Gonorrhea: NEGATIVE
Trichomonas: NEGATIVE

## 2019-11-13 LAB — CYTOLOGY - PAP
Comment: NEGATIVE
Diagnosis: NEGATIVE
High risk HPV: NEGATIVE

## 2019-11-13 MED ORDER — DOXYLAMINE-PYRIDOXINE 10-10 MG PO TBEC: 2.0000 | DELAYED_RELEASE_TABLET | Freq: Every day | ORAL | 5 refills | Status: DC

## 2019-11-13 MED ORDER — PREPLUS 27-1 MG PO TABS: 1.0000 | ORAL_TABLET | Freq: Every day | ORAL | 13 refills | Status: AC

## 2019-11-13 MED ORDER — ONDANSETRON 4 MG PO TBDP: 4.0000 mg | ORAL_TABLET | Freq: Four times a day (QID) | ORAL | 0 refills | Status: DC | PRN

## 2019-11-13 MED ORDER — ASPIRIN EC 81 MG PO TBEC: 81.0000 mg | DELAYED_RELEASE_TABLET | Freq: Every day | ORAL | 2 refills | Status: DC

## 2019-11-13 NOTE — Progress Notes (Signed)
  Subjective:    Vanessa Molina is a [id number] 110w2d being seen today for her first obstetrical visit.  Her obstetrical history is significant for advanced maternal age and history of preeclampsia. Patient does intend to breast feed. Pregnancy history fully reviewed.  Patient reports nausea.  Vitals:   11/11/19 1449  BP: 103/67  Pulse: 80  Weight: 147 lb 14.4 oz (67.1 kg)    HISTORY: OB History  Gravida Para Term Preterm AB Living  5 3 2 1 1 2   SAB TAB Ectopic Multiple Live Births  1 0 0 0 3    # Outcome Date GA Lbr Len/2nd Weight Sex Delivery Anes PTL Lv  5 Current           4 Term 05/01/17 [redacted]w[redacted]d    CS-LTranv   LIV  3 SAB 09/07/15 [redacted]w[redacted]d         2 Term 01/26/11 [redacted]w[redacted]d   M CS-LTranv   LIV     Complications: PIH (pregnancy induced hypertension)  1 Preterm 12/15/07 [redacted]w[redacted]d    CS-LTranv   DEC   Past Medical History:  Diagnosis Date  . Headaches, cluster   . History of pre-eclampsia in prior pregnancy, currently pregnant   . Pregnancy induced hypertension    Past Surgical History:  Procedure Laterality Date  . CESAREAN SECTION     x2  . CESAREAN SECTION N/A 05/01/2017   Procedure: REPEAT CESAREAN SECTION;  Surgeon: 05/03/2017, DO;  Location: [facility] BIRTHING SUITES;  Service: Obstetrics;  Laterality: N/A;   Family History  Problem Relation Age of Onset  . Diabetes Maternal Aunt   . Diabetes Maternal Uncle   . Hypertension Paternal Aunt   . Hypertension Paternal Uncle      Exam    Uterus:     Pelvic Exam:    Perineum: No Hemorrhoids   Vulva: normal   Vagina:  normal mucosa   pH:     Cervix: no lesions   Adnexa: normal adnexa   Bony Pelvis: average  System: Breast:  normal appearance, no masses or tenderness   Skin: normal coloration and turgor, no rashes    Neurologic: oriented, normal mood   Extremities: normal strength, tone, and muscle mass   HEENT PERRLA   Mouth/Teeth mucous membranes moist, pharynx normal without lesions and dental hygiene good   Neck supple    Cardiovascular: regular rate and rhythm   Respiratory:  appears well, vitals normal, no respiratory distress, acyanotic, normal RR, neck free of mass or lymphadenopathy, chest clear, no wheezing, crepitations, rhonchi, normal symmetric air entry   Abdomen: soft, non-tender; bowel sounds normal; no masses,  no organomegaly   Urinary: urethral meatus normal      Assessment:    Pregnancy: [facility] Patient Active Problem List   Diagnosis Date Noted  . Supervision of high risk pregnancy, antepartum 11/11/2019  . Pregnancy with history of neonatal death November 26, 2016  . Language barrier 10/12/2016  . 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 Prenatal vitamins. Problem list reviewed and updated. Genetic Screening discussedordered.  Ultrasound discussed; fetal survey: ordered.  Follow up in 4 weeks. 50% of 30 min visit spent on counseling and coordination of care.  ASA n81 mg/day Zofran prn nausea   13/11/2014 11/13/2019

## 2019-11-16 LAB — URINE CULTURE, OB REFLEX

## 2019-11-16 LAB — CULTURE, OB URINE

## 2019-11-18 ENCOUNTER — Encounter: Payer: Self-pay | Admitting: Family Medicine

## 2019-11-18 DIAGNOSIS — O09299 Supervision of pregnancy with other poor reproductive or obstetric history, unspecified trimester: Secondary | ICD-10-CM | POA: Insufficient documentation

## 2019-11-25 DIAGNOSIS — O0990 Supervision of high risk pregnancy, unspecified, unspecified trimester: Secondary | ICD-10-CM | POA: Diagnosis not present

## 2019-12-09 ENCOUNTER — Telehealth (INDEPENDENT_AMBULATORY_CARE_PROVIDER_SITE_OTHER): Payer: Medicaid Other | Admitting: Advanced Practice Midwife

## 2019-12-09 DIAGNOSIS — O09292 Supervision of pregnancy with other poor reproductive or obstetric history, second trimester: Secondary | ICD-10-CM

## 2019-12-09 DIAGNOSIS — O0990 Supervision of high risk pregnancy, unspecified, unspecified trimester: Secondary | ICD-10-CM

## 2019-12-09 DIAGNOSIS — O0992 Supervision of high risk pregnancy, unspecified, second trimester: Secondary | ICD-10-CM | POA: Diagnosis not present

## 2019-12-09 DIAGNOSIS — Z3A16 16 weeks gestation of pregnancy: Secondary | ICD-10-CM | POA: Diagnosis not present

## 2019-12-09 DIAGNOSIS — O09299 Supervision of pregnancy with other poor reproductive or obstetric history, unspecified trimester: Secondary | ICD-10-CM

## 2019-12-09 NOTE — Progress Notes (Signed)
Virtual ROB   Dialed pt using Interpreter (Arabic)  [phone]  CC: NONE

## 2019-12-09 NOTE — Progress Notes (Signed)
   TELEHEALTH VIRTUAL OBSTETRICS VISIT ENCOUNTER NOTE  I connected with Vanessa Molina on 12/09/19 at  4:00 PM EST by telephone at home and verified that I am speaking with the correct person using two identifiers.   I discussed the limitations, risks, security and privacy concerns of performing an evaluation and management service by telephone and the availability of in person appointments. I also discussed with the patient that there may be a patient responsible charge related to this service. The patient expressed understanding and agreed to proceed.  Subjective:  Vanessa Molina is a 34 y.o. [id number] at [redacted]w[redacted]d being followed for ongoing prenatal care.  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  Patient reports nausea. Reports fetal movement. Denies any contractions, bleeding or leaking of fluid.   The following portions of the patient's history were reviewed and updated as appropriate: allergies, current medications, past family history, past medical history, past social history, past surgical history and problem list.   Objective:   General:  Alert, oriented and cooperative.   Mental Status: Normal mood and affect perceived. Normal judgment and thought content.  Rest of physical exam deferred due to type of encounter  Assessment and Plan:  Pregnancy: [id number] at [redacted]w[redacted]d 1. Supervision of high risk pregnancy, antepartum --Pt denies cramping, LOF, or vaginal bleeding --Reports occasional nausea without vomiting --Doing well   2. Hx of preeclampsia, prior pregnancy, currently pregnant --Needs baseline labs --Next visit in 4 weeks, in person for PEC baseline labs  Preterm labor symptoms and general obstetric precautions including  but not limited to vaginal bleeding, contractions, leaking of fluid and fetal movement were reviewed in detail with the patient.  I discussed the assessment and treatment plan with the patient. The patient was provided an opportunity to ask questions and all were answered. The patient agreed with the plan and demonstrated an understanding of the instructions. The patient was advised to call back or seek an in-person office evaluation/go to [facility] at [facility] for any urgent or concerning symptoms. Please refer to After Visit Summary for other counseling recommendations.   I provided 7 minutes of non-face-to-face time during this encounter.  No follow-ups on file.  Future Appointments  Date Time Provider Department Center  12/30/2019  2:30 PM WH-MFC [place] 5 WH-MFCUS M[facility]-US    Sharen Counter, CNM Center for [employer], [facility]

## 2019-12-30 ENCOUNTER — Other Ambulatory Visit: Payer: Self-pay

## 2019-12-30 ENCOUNTER — Ambulatory Visit (HOSPITAL_COMMUNITY)
Admission: RE | Admit: 2019-12-30 | Discharge: 2019-12-30 | Disposition: A | Discharge status: Home / Self Care | Payer: Medicaid Other | Source: Ambulatory Visit | Attending: Obstetrics and Gynecology | Admitting: Obstetrics and Gynecology

## 2019-12-30 ENCOUNTER — Other Ambulatory Visit (HOSPITAL_COMMUNITY): Payer: Self-pay | Admitting: *Deleted

## 2019-12-30 ENCOUNTER — Other Ambulatory Visit: Payer: Self-pay | Admitting: Obstetrics & Gynecology

## 2019-12-30 DIAGNOSIS — O0992 Supervision of high risk pregnancy, unspecified, second trimester: Secondary | ICD-10-CM | POA: Diagnosis not present

## 2019-12-30 DIAGNOSIS — O0990 Supervision of high risk pregnancy, unspecified, unspecified trimester: Secondary | ICD-10-CM | POA: Insufficient documentation

## 2019-12-30 DIAGNOSIS — Z3A19 19 weeks gestation of pregnancy: Secondary | ICD-10-CM | POA: Diagnosis not present

## 2019-12-30 DIAGNOSIS — Z362 Encounter for other antenatal screening follow-up: Secondary | ICD-10-CM

## 2020-01-06 ENCOUNTER — Other Ambulatory Visit: Payer: Self-pay

## 2020-01-06 ENCOUNTER — Ambulatory Visit (INDEPENDENT_AMBULATORY_CARE_PROVIDER_SITE_OTHER): Payer: Medicaid Other | Admitting: Advanced Practice Midwife

## 2020-01-06 ENCOUNTER — Encounter: Payer: Medicaid Other | Admitting: Advanced Practice Midwife

## 2020-01-06 VITALS — BP 104/70 | HR 81 | Wt 157.0 lb

## 2020-01-06 DIAGNOSIS — Z3A20 20 weeks gestation of pregnancy: Secondary | ICD-10-CM

## 2020-01-06 DIAGNOSIS — O34219 Maternal care for unspecified type scar from previous cesarean delivery: Secondary | ICD-10-CM

## 2020-01-06 DIAGNOSIS — L249 Irritant contact dermatitis, unspecified cause: Secondary | ICD-10-CM

## 2020-01-06 DIAGNOSIS — O09292 Supervision of pregnancy with other poor reproductive or obstetric history, second trimester: Secondary | ICD-10-CM | POA: Diagnosis not present

## 2020-01-06 DIAGNOSIS — Z3480 Encounter for supervision of other normal pregnancy, unspecified trimester: Secondary | ICD-10-CM

## 2020-01-06 MED ORDER — HYDROCORTISONE 2.5 % EX CREA: TOPICAL_CREAM | Freq: Two times a day (BID) | CUTANEOUS | 0 refills | Status: DC

## 2020-01-06 NOTE — Patient Instructions (Signed)
Contact Dermatitis Dermatitis is redness, soreness, and swelling (inflammation) of the skin. Contact dermatitis is a reaction to certain substances that touch the skin. Many different substances can cause contact dermatitis. There are two types of contact dermatitis:  Irritant contact dermatitis. This type is caused by something that irritates your skin, such as having dry hands from washing them too often with soap. This type does not require previous exposure to the substance for a reaction to occur. This is the most common type.  Allergic contact dermatitis. This type is caused by a substance that you are allergic to, such as poison ivy. This type occurs when you have been exposed to the substance (allergen) and develop a sensitivity to it. Dermatitis may develop soon after your first exposure to the allergen, or it may not develop until the next time you are exposed and every time thereafter. What are the causes? Irritant contact dermatitis is most commonly caused by exposure to:  Makeup.  Soaps.  Detergents.  Bleaches.  Acids.  Metal salts, such as nickel. Allergic contact dermatitis is most commonly caused by exposure to:  Poisonous plants.  Chemicals.  Jewelry.  Latex.  Medicines.  Preservatives in products, such as clothing. What increases the risk? You are more likely to develop this condition if you have:  A job that exposes you to irritants or allergens.  Certain medical conditions, such as asthma or eczema. What are the signs or symptoms? Symptoms of this condition may occur on your body anywhere the irritant has touched you or is touched by you.  Symptoms include: ? Dryness or flaking. ? Redness. ? Cracks. ? Itching. ? Pain or a burning feeling. ? Blisters. ? Drainage of small amounts of blood or clear fluid from skin cracks. With allergic contact dermatitis, there may also be swelling in areas such as the eyelids, mouth, or genitals. How is this  diagnosed? This condition is diagnosed with a medical history and physical exam.  A patch skin test may be performed to help determine the cause.  If the condition is related to your job, you may need to see an occupational medicine specialist. How is this treated? This condition is treated by checking for the cause of the reaction and protecting your skin from further contact. Treatment may also include:  Steroid creams or ointments. Oral steroid medicines may be needed in more severe cases.  Antibiotic medicines or antibacterial ointments, if a skin infection is present.  Antihistamine lotion or an antihistamine taken by mouth to ease itching.  A bandage (dressing). Follow these instructions at home: Skin care  Moisturize your skin as needed.  Apply cool compresses to the affected areas.  Try applying baking soda paste to your skin. Stir water into baking soda until it reaches a paste-like consistency.  Do not scratch your skin, and avoid friction to the affected area.  Avoid the use of soaps, perfumes, and dyes. Medicines  Take or apply over-the-counter and prescription medicines only as told by your health care provider.  If you were prescribed an antibiotic medicine, take or apply the antibiotic as told by your health care provider. Do not stop using the antibiotic even if your condition improves. Bathing  Try taking a bath with: ? Epsom salts. Follow the instructions on the packaging. You can get these at your local pharmacy or grocery store. ? Baking soda. Pour a small amount into the bath as directed by your health care provider. ? Colloidal oatmeal. Follow the instructions on the   packaging. You can get this at your local pharmacy or grocery store.  Bathe less frequently, such as every other day.  Bathe in lukewarm water. Avoid using hot water. Bandage care  If you were given a bandage (dressing), change it as told by your health care provider.  Wash your hands  with soap and water before and after you change your dressing. If soap and water are not available, use hand sanitizer. General instructions  Avoid the substance that caused your reaction. If you do not know what caused it, keep a journal to try to track what caused it. Write down: ? What you eat. ? What cosmetic products you use. ? What you drink. ? What you wear in the affected area. This includes jewelry.  Check the affected areas every day for signs of infection. Check for: ? More redness, swelling, or pain. ? More fluid or blood. ? Warmth. ? Pus or a bad smell.  Keep all follow-up visits as told by your health care provider. This is important. Contact a health care provider if:  Your condition does not improve with treatment.  Your condition gets worse.  You have signs of infection such as swelling, tenderness, redness, soreness, or warmth in the affected area.  You have a fever.  You have new symptoms. Get help right away if:  You have a severe headache, neck pain, or neck stiffness.  You vomit.  You feel very sleepy.  You notice red streaks coming from the affected area.  Your bone or joint underneath the affected area becomes painful after the skin has healed.  The affected area turns darker.  You have difficulty breathing. Summary  Dermatitis is redness, soreness, and swelling (inflammation) of the skin. Contact dermatitis is a reaction to certain substances that touch the skin.  Symptoms of this condition may occur on your body anywhere the irritant has touched you or is touched by you.  This condition is treated by figuring out what caused the reaction and protecting your skin from further contact. Treatment may also include medicines and skin care.  Avoid the substance that caused your reaction. If you do not know what caused it, keep a journal to try to track what caused it.  Contact a health care provider if your condition gets worse or you have signs  of infection such as swelling, tenderness, redness, soreness, or warmth in the affected area. This information is not intended to replace advice given to you by your health care provider. Make sure you discuss any questions you have with your health care provider. Document Revised: 02/12/2019 Document Reviewed: 05/08/2018 Elsevier Patient Education  2020 [employer] Inc.  

## 2020-01-06 NOTE — Progress Notes (Signed)
   PRENATAL VISIT NOTE  Subjective:  Vanessa Molina is a 34 y.o. [id number] at 5w0dbeing seen today for ongoing prenatal care.  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  Patient reports no complaints.  Contractions: Not present. Vag. Bleeding: None.  Movement: Present. Denies leaking of fluid.   The following portions of the patient's history were reviewed and updated as appropriate: allergies, current medications, past family history, past medical history, past social history, past surgical history and problem list.   Objective:   Vitals:   01/06/20 1552  BP: 104/70  Pulse: 81  Weight: 157 lb (71.2 kg)    Fetal Status: Fetal Heart Rate (bpm): 150   Movement: Present     General:  Alert, oriented and cooperative. Patient is in no acute distress.  Skin: Skin is warm and dry. No rash noted.   Cardiovascular: Normal heart rate noted  Respiratory: Normal respiratory effort, no problems with respiration noted  Abdomen: Soft, gravid, appropriate for gestational age.  Pain/Pressure: Absent     Pelvic: Cervical exam deferred        Extremities: Normal range of motion.  Edema: None  Mental Status: Normal mood and affect. Normal behavior. Normal judgment and thought content.   Assessment and Plan:  Pregnancy: [id number] 273w0d. Hx of preeclampsia, prior pregnancy, currently pregnant, second trimester --Baseline labs today - Comp Met (CMET) - Protein / creatinine ratio, urine  2. Irritant contact dermatitis, unspecified trigger --C/w contact dermatitis, on wrist, ankles only. --Rx for hydrocortisone 2.5 % cream, BID PRN  3. Supervision of other normal pregnancy, antepartum --Anticipatory guidance about next visits/weeks  of pregnancy given. --Next visit in 4 weeks virtual  4. Previous cesarean delivery, antepartum --due to severe PEC  Preterm labor symptoms and general obstetric precautions including but not limited to vaginal bleeding, contractions, leaking of fluid and fetal movement were reviewed in detail with the patient. Please refer to After Visit Summary for other counseling recommendations.   Return in about 4 weeks (around 02/03/2020).  Future Appointments  Date Time Provider [place]/20[place]  3:15 PM [place]  LiFatima BlankCNM

## 2020-01-07 LAB — COMPREHENSIVE METABOLIC PANEL
ALT: 14 IU/L (ref 0–32)
AST: 15 IU/L (ref 0–40)
Albumin/Globulin Ratio: 1.3 (ref 1.2–2.2)
Albumin: 3.6 g/dL — ABNORMAL LOW (ref 3.8–4.8)
Alkaline Phosphatase: 67 IU/L (ref 39–117)
BUN/Creatinine Ratio: 16 (ref 9–23)
BUN: 7 mg/dL (ref 6–20)
Bilirubin Total: <0.2 mg/dL (ref 0.0–1.2)
CO2: 21 mmol/L (ref 20–29)
Calcium: 8.5 mg/dL — ABNORMAL LOW (ref 8.7–10.2)
Chloride: 104 mmol/L (ref 96–106)
Creatinine, Ser: 0.44 mg/dL — ABNORMAL LOW (ref 0.57–1.00)
GFR calc Af Amer: 151 mL/min/{1.73_m2} (ref >59)
GFR calc non Af Amer: 131 mL/min/{1.73_m2} (ref >59)
Globulin, Total: 2.8 g/dL (ref 1.5–4.5)
Glucose: 101 mg/dL — ABNORMAL HIGH (ref 65–99)
Potassium: 4.2 mmol/L (ref 3.5–5.2)
Sodium: 138 mmol/L (ref 134–144)
Total Protein: 6.4 g/dL (ref 6.0–8.5)

## 2020-01-07 LAB — PROTEIN / CREATININE RATIO, URINE
Creatinine, Urine: 131.9 mg/dL
Protein, Ur: 15.9 mg/dL
Protein/Creat Ratio: 121 mg/g creat (ref 0–200)

## 2020-01-27 ENCOUNTER — Other Ambulatory Visit: Payer: Self-pay

## 2020-01-27 ENCOUNTER — Ambulatory Visit (HOSPITAL_COMMUNITY)
Admission: RE | Admit: 2020-01-27 | Discharge: 2020-01-27 | Disposition: A | Discharge status: Home / Self Care | Payer: Medicaid Other | Source: Ambulatory Visit | Attending: Obstetrics and Gynecology | Admitting: Obstetrics and Gynecology

## 2020-01-27 DIAGNOSIS — O34219 Maternal care for unspecified type scar from previous cesarean delivery: Secondary | ICD-10-CM

## 2020-01-27 DIAGNOSIS — Z362 Encounter for other antenatal screening follow-up: Secondary | ICD-10-CM | POA: Diagnosis not present

## 2020-01-27 DIAGNOSIS — Z3A23 23 weeks gestation of pregnancy: Secondary | ICD-10-CM | POA: Diagnosis not present

## 2020-01-27 DIAGNOSIS — O09292 Supervision of pregnancy with other poor reproductive or obstetric history, second trimester: Secondary | ICD-10-CM

## 2020-01-27 DIAGNOSIS — O09522 Supervision of elderly multigravida, second trimester: Secondary | ICD-10-CM | POA: Diagnosis not present

## 2020-01-28 ENCOUNTER — Other Ambulatory Visit (HOSPITAL_COMMUNITY): Payer: Self-pay | Admitting: *Deleted

## 2020-01-28 DIAGNOSIS — O09523 Supervision of elderly multigravida, third trimester: Secondary | ICD-10-CM

## 2020-02-03 ENCOUNTER — Other Ambulatory Visit: Payer: Self-pay

## 2020-02-03 ENCOUNTER — Ambulatory Visit (INDEPENDENT_AMBULATORY_CARE_PROVIDER_SITE_OTHER): Payer: Medicaid Other | Admitting: Advanced Practice Midwife

## 2020-02-03 VITALS — BP 107/73 | HR 89 | Wt 162.0 lb

## 2020-02-03 DIAGNOSIS — O34219 Maternal care for unspecified type scar from previous cesarean delivery: Secondary | ICD-10-CM

## 2020-02-03 DIAGNOSIS — L259 Unspecified contact dermatitis, unspecified cause: Secondary | ICD-10-CM

## 2020-02-03 DIAGNOSIS — O3482 Maternal care for other abnormalities of pelvic organs, second trimester: Secondary | ICD-10-CM

## 2020-02-03 DIAGNOSIS — Z3A24 24 weeks gestation of pregnancy: Secondary | ICD-10-CM

## 2020-02-03 DIAGNOSIS — O99712 Diseases of the skin and subcutaneous tissue complicating pregnancy, second trimester: Secondary | ICD-10-CM

## 2020-02-03 DIAGNOSIS — N90810 Female genital mutilation status, unspecified: Secondary | ICD-10-CM

## 2020-02-03 DIAGNOSIS — O0990 Supervision of high risk pregnancy, unspecified, unspecified trimester: Secondary | ICD-10-CM

## 2020-02-03 MED ORDER — CLOBETASOL PROPIONATE 0.05 % EX OINT: 1.0000 "application " | TOPICAL_OINTMENT | Freq: Two times a day (BID) | CUTANEOUS | 0 refills | Status: DC

## 2020-02-03 NOTE — Patient Instructions (Signed)
Rash, Adult A rash is a change in the color of your skin. A rash can also change the way your skin feels. There are many different conditions and factors that can cause a rash. Some rashes may disappear after a few days, but some may last for a few weeks. Common causes of rashes include:  Viral infections, such as: ? Colds. ? Measles. ? Hand, foot, and mouth disease.  Bacterial infections, such as: ? Scarlet fever. ? Impetigo.  Fungal infections, such as Candida.  Allergic reactions to food, medicines, or skin care products. Follow these instructions at home: The goal of treatment is to stop the itching and keep the rash from spreading. Pay attention to any changes in your symptoms. Follow these instructions to help with your condition: Medicine Take or apply over-the-counter and prescription medicines only as told by your health care provider. These may include:  Corticosteroid creams to treat red or swollen skin.  Anti-itch lotions.  Oral allergy medicines (antihistamines).  Oral corticosteroids for severe symptoms.  Skin care  Apply cool compresses to the affected areas.  Do not scratch or rub your skin.  Avoid covering the rash. Make sure the rash is exposed to air as much as possible. Managing itching and discomfort  Avoid hot showers or baths, which can make itching worse. A cold shower may help.  Try taking a bath with: ? Epsom salts. Follow manufacturer instructions on the packaging. You can get these at your local pharmacy or grocery store. ? Baking soda. Pour a small amount into the bath as told by your health care provider. ? Colloidal oatmeal. Follow manufacturer instructions on the packaging. You can get this at your local pharmacy or grocery store.  Try applying baking soda paste to your skin. Stir water into baking soda until it reaches a paste-like consistency.  Try applying calamine lotion. This is an over-the-counter lotion that helps to relieve  itchiness.  Keep cool and out of the sun. Sweating and being hot can make itching worse. General instructions   Rest as needed.  Drink enough fluid to keep your urine pale yellow.  Wear loose-fitting clothing.  Avoid scented soaps, detergents, and perfumes. Use gentle soaps, detergents, perfumes, and other cosmetic products.  Avoid any substance that causes your rash. Keep a journal to help track what causes your rash. Write down: ? What you eat. ? What cosmetic products you use. ? What you drink. ? What you wear. This includes jewelry.  Keep all follow-up visits as told by your health care provider. This is important. Contact a health care provider if:  You sweat at night.  You lose weight.  You urinate more than normal.  You urinate less than normal, or you notice that your urine is a darker color than usual.  You feel weak.  You vomit.  Your skin or the whites of your eyes look yellow (jaundice).  Your skin: ? Tingles. ? Is numb.  Your rash: ? Does not go away after several days. ? Gets worse.  You are: ? Unusually thirsty. ? More tired than normal.  You have: ? New symptoms. ? Pain in your abdomen. ? A fever. ? Diarrhea. Get help right away if you:  Have a fever and your symptoms suddenly get worse.  Develop confusion.  Have a severe headache or a stiff neck.  Have severe joint pains or stiffness.  Have a seizure.  Develop a rash that covers all or most of your body. The rash may   or may not be painful.  Develop blisters that: ? Are on top of the rash. ? Grow larger or grow together. ? Are painful. ? Are inside your nose or mouth.  Develop a rash that: ? Looks like purple pinprick-sized spots all over your body. ? Has a "bull's eye" or looks like a target. ? Is not related to sun exposure, is red and painful, and causes your skin to peel. Summary  A rash is a change in the color of your skin. Some rashes disappear after a few days,  but some may last for a few weeks.  The goal of treatment is to stop the itching and keep the rash from spreading.  Take or apply over-the-counter and prescription medicines only as told by your health care provider.  Contact a health care provider if you have new or worsening symptoms.  Keep all follow-up visits as told by your health care provider. This is important. This information is not intended to replace advice given to you by your health care provider. Make sure you discuss any questions you have with your health care provider. Document Revised: 02/14/2019 Document Reviewed: 05/27/2018 Elsevier Patient Education  2020 [employer]vier Inc[employer]  

## 2020-02-03 NOTE — Progress Notes (Signed)
   PRENATAL VISIT NOTE  Subjective:  Vanessa Molina is a 34 y.o. [id number] at [redacted]w[redacted]d being seen today for ongoing prenatal care.  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  Patient reports rash of BUE and BLE.  Contractions: Not present. Vag. Bleeding: None.  Movement: Present. Denies leaking of fluid.   The following portions of the patient's history were reviewed and updated as appropriate: allergies, current medications, past family history, past medical history, past social history, past surgical history and problem list.   Objective:   Vitals:   02/03/20 1603  BP: 107/73  Pulse: 89  Weight: 162 lb (73.5 kg)    Fetal Status: Fetal Heart Rate (bpm): 158   Movement: Present     General:  Alert, oriented and cooperative. Patient is in no acute distress.  Skin: Skin is warm and dry. No rash noted.   Cardiovascular: Normal heart rate noted  Respiratory: Normal respiratory effort, no problems with respiration noted  Abdomen: Soft, gravid, appropriate for gestational age.  Pain/Pressure: Absent     Pelvic: Cervical exam deferred        Extremities: Normal range of motion.  Edema: None  Mental Status: Normal mood and affect. Normal behavior. Normal judgment and thought content.   Assessment and Plan:  Pregnancy: [id number] at [redacted]w[redacted]d  1. Contact dermatitis, unspecified contact dermatitis type, unspecified trigger --Pt with multiple flat mildly erythematous lesions on BUE and BLE with pruritis.  No known triggers.  Rx for hydrocortisone at last visit was never received so she has not used any medication for treatment. --Rash is worsening, no evidence of infectious cause, most likely an irritant contact dermatitis but will try  steroid ointment BID x 7 days and follow up with pt in 1 week by phone.  If not improved, refer to dermatology. - clobetasol ointment (TEMOVATE) 0.05 %; Apply 1 application topically 2 (two) times daily.  Dispense: 30 g; Refill: 0  2. Supervision of high risk pregnancy --For AMA and hx delivery for severe PEC with neonatal death, likely iatrogenic.   --Growth scans by MFM, wnl 01/27/20 --Anticipatory guidance about next visits/weeks of pregnancy given. --Next visit in 4 weeks for GTT. This is during Ramadan, and discussed with pt who is Ok doing glucose test during daily fasting.  She is aware she will be unable to eat after the test and reports she is comfortable with this. Offered to schedule the test either before or after Ramadan but pt prefers to do the test on schedule.   3. Previous cesarean delivery, antepartum --x3 with planned repeat  Preterm labor symptoms and general obstetric precautions including but not limited to vaginal bleeding, contractions, leaking of fluid and fetal movement were reviewed in detail with the patient. Please refer to After Visit Summary for other counseling recommendations.   Return in about 4 weeks (around 03/02/2020).  Future Appointments  Date Time Provider Department Center  03/02/2020 11:30 AM [facility] NURSE [facility]  03/02/2020 11:30 AM [facility] [place] 5 [facility]    Sharen Counter, CNM

## 2020-02-03 NOTE — Progress Notes (Signed)
ROB  CC: itching Rash on skin.   Pt states she never received Rx for cream.

## 2020-02-17 ENCOUNTER — Telehealth: Payer: Self-pay

## 2020-02-17 NOTE — Telephone Encounter (Signed)
Returned call and advised of safe meds to take OTC for allergies during pregnancy.

## 2020-03-02 ENCOUNTER — Other Ambulatory Visit: Payer: Self-pay

## 2020-03-02 ENCOUNTER — Ambulatory Visit (HOSPITAL_COMMUNITY)
Admission: RE | Admit: 2020-03-02 | Discharge: 2020-03-02 | Disposition: A | Discharge status: Home / Self Care | Payer: Medicaid Other | Source: Ambulatory Visit | Attending: Obstetrics and Gynecology | Admitting: Obstetrics and Gynecology

## 2020-03-02 ENCOUNTER — Other Ambulatory Visit (HOSPITAL_COMMUNITY): Payer: Self-pay | Admitting: *Deleted

## 2020-03-02 ENCOUNTER — Ambulatory Visit (INDEPENDENT_AMBULATORY_CARE_PROVIDER_SITE_OTHER): Payer: Medicaid Other | Admitting: Obstetrics and Gynecology

## 2020-03-02 ENCOUNTER — Other Ambulatory Visit: Payer: Medicaid Other

## 2020-03-02 ENCOUNTER — Ambulatory Visit (HOSPITAL_COMMUNITY): Payer: Medicaid Other | Admitting: *Deleted

## 2020-03-02 ENCOUNTER — Encounter: Payer: Self-pay | Admitting: Obstetrics and Gynecology

## 2020-03-02 ENCOUNTER — Encounter (HOSPITAL_COMMUNITY): Payer: Self-pay

## 2020-03-02 VITALS — BP 106/72 | HR 78 | Wt 160.0 lb

## 2020-03-02 DIAGNOSIS — Z789 Other specified health status: Secondary | ICD-10-CM

## 2020-03-02 DIAGNOSIS — O34219 Maternal care for unspecified type scar from previous cesarean delivery: Secondary | ICD-10-CM

## 2020-03-02 DIAGNOSIS — O0990 Supervision of high risk pregnancy, unspecified, unspecified trimester: Secondary | ICD-10-CM | POA: Diagnosis not present

## 2020-03-02 DIAGNOSIS — O09299 Supervision of pregnancy with other poor reproductive or obstetric history, unspecified trimester: Secondary | ICD-10-CM

## 2020-03-02 DIAGNOSIS — O365990 Maternal care for other known or suspected poor fetal growth, unspecified trimester, not applicable or unspecified: Secondary | ICD-10-CM | POA: Insufficient documentation

## 2020-03-02 DIAGNOSIS — Z3A28 28 weeks gestation of pregnancy: Secondary | ICD-10-CM | POA: Diagnosis not present

## 2020-03-02 DIAGNOSIS — O09523 Supervision of elderly multigravida, third trimester: Secondary | ICD-10-CM | POA: Diagnosis not present

## 2020-03-02 DIAGNOSIS — O24410 Gestational diabetes mellitus in pregnancy, diet controlled: Secondary | ICD-10-CM

## 2020-03-02 MED ORDER — PANTOPRAZOLE SODIUM 20 MG PO TBEC: 20.0000 mg | DELAYED_RELEASE_TABLET | Freq: Every day | ORAL | 3 refills | Status: AC

## 2020-03-02 NOTE — Progress Notes (Signed)
ROB/GTT.  Declined TDAP vaccine.  C/o heartburns and seasonal allergies affecting her eyes.

## 2020-03-02 NOTE — Progress Notes (Signed)
   PRENATAL VISIT NOTE  Subjective:  Vanessa Molina is a 34 y.o. [id number] at [redacted]w[redacted]d being seen today for ongoing prenatal care.  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  Patient reports heartburn.  Contractions: Not present. Vag. Bleeding: None.  Movement: Present. Denies leaking of fluid.   The following portions of the patient's history were reviewed and updated as appropriate: allergies, current medications, past family history, past medical history, past social history, past surgical history and problem list.   Objective:   Vitals:   03/02/20 0820  BP: 106/72  Pulse: 78  Weight: 160 lb (72.6 kg)    Fetal Status: Fetal Heart Rate (bpm): 143 Fundal Height: 27 cm Movement: Present     General:  Alert, oriented and cooperative. Patient is in no acute distress.  Skin: Skin is warm and dry. No rash noted.   Cardiovascular: Normal heart rate noted  Respiratory: Normal respiratory effort, no problems with respiration noted  Abdomen: Soft, gravid, appropriate for gestational age.  Pain/Pressure: Absent     Pelvic: Cervical exam deferred        Extremities: Normal range of motion.  Edema: None  Mental Status: Normal mood and affect. Normal behavior. Normal judgment and thought content.   Assessment and Plan:  Pregnancy: [id number] at [redacted]w[redacted]d 1. Supervision of high risk pregnancy, antepartum Patient is doing well. Rx for heartburn provided Glucola and third trimester labs Patient declined tdap Patient plans condoms for contraception - Glucose Tolerance, 2 Hours w/1 Hour - CBC - HIV antibody (with reflex) - RPR  2. History of pre-eclampsia in prior pregnancy,  currently pregnant Continue ASA  3. Language barrier   4. Previous cesarean delivery affecting pregnancy, antepartum Will be scheduled for repeat  Preterm labor symptoms and general obstetric precautions including but not limited to vaginal bleeding, contractions, leaking of fluid and fetal movement were reviewed in detail with the patient. Please refer to After Visit Summary for other counseling recommendations.   Return in about 2 weeks (around 03/16/2020) for in person, ROB, Low risk.  Future Appointments  Date Time Provider Department Center  03/02/2020 11:30 AM [facility] NURSE [facility]  03/02/2020 11:30 AM [facility] [place] 5 [facility]    Catalina Antigua, MD

## 2020-03-03 ENCOUNTER — Other Ambulatory Visit (HOSPITAL_COMMUNITY): Payer: Self-pay | Admitting: *Deleted

## 2020-03-03 DIAGNOSIS — O24410 Gestational diabetes mellitus in pregnancy, diet controlled: Secondary | ICD-10-CM | POA: Insufficient documentation

## 2020-03-03 DIAGNOSIS — O365990 Maternal care for other known or suspected poor fetal growth, unspecified trimester, not applicable or unspecified: Secondary | ICD-10-CM

## 2020-03-03 LAB — CBC
Hematocrit: 32 % — ABNORMAL LOW (ref 34.0–46.6)
Hemoglobin: 10.3 g/dL — ABNORMAL LOW (ref 11.1–15.9)
MCH: 27.5 pg (ref 26.6–33.0)
MCHC: 32.2 g/dL (ref 31.5–35.7)
MCV: 85 fL (ref 79–97)
Platelets: 241 10*3/uL (ref 150–450)
RBC: 3.75 x10E6/uL — ABNORMAL LOW (ref 3.77–5.28)
RDW: 13.1 % (ref 11.7–15.4)
WBC: 4.4 10*3/uL (ref 3.4–10.8)

## 2020-03-03 LAB — GLUCOSE TOLERANCE, 2 HOURS W/ 1HR
Glucose, 1 hour: 170 mg/dL (ref 65–179)
Glucose, 2 hour: 93 mg/dL (ref 65–152)
Glucose, Fasting: 108 mg/dL — ABNORMAL HIGH (ref 65–91)

## 2020-03-03 LAB — RPR: RPR Ser Ql: NONREACTIVE

## 2020-03-03 LAB — HIV ANTIBODY (ROUTINE TESTING W REFLEX): HIV Screen 4th Generation wRfx: NONREACTIVE

## 2020-03-03 NOTE — Addendum Note (Signed)
Addended by: Catalina Antigua on: 03/03/2020 02:33 PM   Modules accepted: Orders

## 2020-03-09 ENCOUNTER — Encounter: Payer: Self-pay | Admitting: Skilled Nursing Facility1

## 2020-03-09 ENCOUNTER — Other Ambulatory Visit: Payer: Self-pay

## 2020-03-09 ENCOUNTER — Encounter: Payer: Medicaid Other | Attending: Obstetrics and Gynecology | Admitting: Skilled Nursing Facility1

## 2020-03-09 DIAGNOSIS — O24419 Gestational diabetes mellitus in pregnancy, unspecified control: Secondary | ICD-10-CM | POA: Diagnosis not present

## 2020-03-09 DIAGNOSIS — O24410 Gestational diabetes mellitus in pregnancy, diet controlled: Secondary | ICD-10-CM

## 2020-03-09 MED ORDER — ACCU-CHEK GUIDE W/DEVICE KIT: 1.0000 | PACK | Freq: Four times a day (QID) | 0 refills | Status: DC

## 2020-03-09 MED ORDER — ACCU-CHEK GUIDE VI STRP: ORAL_STRIP | 12 refills | Status: DC

## 2020-03-09 MED ORDER — ACCU-CHEK SOFTCLIX LANCETS MISC: 1.0000 | Freq: Four times a day (QID) | 12 refills | Status: DC

## 2020-03-09 NOTE — Progress Notes (Signed)
Pt states she does monitor her blood pressure and it has been WNL. Pt states she is [redacted] weeks along in her pregnancy.  Pt states she has not been checking her blood sugars.  Starts eating at 8 and then 1 after waking at 6am.  Pt states she is not practicing fasting during the [place] season but it has been hard to eat her meals when everyone else is fasting.  Pt given accu check guide me lot [id number] exp 04/07/21 got a number of 89 fasting. Pt states she prefers materials in [place].   Goals: Test 4 times a day: fasting and 2 hours after Do not eat extra pita with your meals Drink half your smoothie and later in the day If you find your blood sugars to be low around 10 you need toe at breakfast Practice proper self monitoring hygiene Call your doctor for prescription for lancets and strips and get from pharmacy  Diabetes Self-Management Education  Visit Type: First/Initial   03/09/2020  Ms. Vanessa Molina, identified by name and date of birth, is a 34 y.o. female with a diagnosis of Diabetes: Gestational Diabetes.   ASSESSMENT  Height 5\' 4"  (1.626 m), weight 161 lb 1.6 oz (73.1 kg), last menstrual period 08/19/2019, unknown if currently breastfeeding. Body mass index is 27.65 kg/m.  Diabetes Self-Management Education - 03/09/20 0913      Visit Information   Visit Type  First/Initial      Initial Visit   Diabetes Type  Gestational Diabetes    Are you currently following a meal plan?  No    Are you taking your medications as prescribed?  Not on Medications      Health Coping   How would you rate your overall health?  Fair      Psychosocial Assessment   Patient Belief/Attitude about Diabetes  Motivated to manage diabetes    Self-management support  Family;Friends    Other persons present  Interpreter      Pre-Education Assessment   Patient understands the diabetes disease and treatment process.  Needs Instruction    Patient understands incorporating nutritional management into  lifestyle.  Needs Instruction    Patient undertands incorporating physical activity into lifestyle.  Needs Instruction    Patient understands using medications safely.  Needs Instruction    Patient understands monitoring blood glucose, interpreting and using results  Needs Instruction    Patient understands prevention, detection, and treatment of acute complications.  Needs Instruction    Patient understands prevention, detection, and treatment of chronic complications.  Needs Instruction    Patient understands how to develop strategies to address psychosocial issues.  Needs Instruction    Patient understands how to develop strategies to promote health/change behavior.  Needs Instruction      Complications   How often do you check your blood sugar?  0 times/day (not testing)    Have you had a dilated eye exam in the past 12 months?  No    Have you had a dental exam in the past 12 months?  No    Are you checking your feet?  No      Dietary Intake   Breakfast  whole or 2% milk or juice    Lunch  pita with beans    Dinner  stew wtih flour with salad and fruit    Beverage(s)  milk, juice, water      Exercise   Exercise Type  ADL's    How many days per week to  you exercise?  0    How many minutes per day do you exercise?  0    Total minutes per week of exercise  0      Patient Education   Previous Diabetes Education  No    Acute complications  Taught treatment of hypoglycemia - the 15 rule.      Individualized Goals (developed by patient)   Nutrition  Follow meal plan discussed    Physical Activity  Exercise 5-7 days per week;30 minutes per day    Monitoring   test my blood glucose as discussed;test blood glucose pre and post meals as discussed      Post-Education Assessment   Patient understands the diabetes disease and treatment process.  Demonstrates understanding / competency    Patient understands incorporating nutritional management into lifestyle.  Demonstrates understanding /  competency    Patient undertands incorporating physical activity into lifestyle.  Demonstrates understanding / competency    Patient understands using medications safely.  Demonstrates understanding / competency    Patient understands monitoring blood glucose, interpreting and using results  Demonstrates understanding / competency    Patient understands prevention, detection, and treatment of acute complications.  Demonstrates understanding / competency    Patient understands prevention, detection, and treatment of chronic complications.  Demonstrates understanding / competency    Patient understands how to develop strategies to address psychosocial issues.  Demonstrates understanding / competency    Patient understands how to develop strategies to promote health/change behavior.  Demonstrates understanding / competency      Outcomes   Expected Outcomes  Demonstrated interest in learning. Expect positive outcomes    Future DMSE  PRN    Program Status  Completed       Individualized Plan for Diabetes Self-Management Training:   Learning Objective:  Patient will have a greater understanding of diabetes self-management. Patient education plan is to attend individual and/or group sessions per assessed needs and concerns.   Plan:   There are no Patient Instructions on file for this visit.  Expected Outcomes:  Demonstrated interest in learning. Expect positive outcomes  Education material provided: GDM packet   If problems or questions, patient to contact team via:  Phone  Future DSME appointment: PRN

## 2020-03-16 ENCOUNTER — Other Ambulatory Visit: Payer: Self-pay

## 2020-03-16 ENCOUNTER — Ambulatory Visit (INDEPENDENT_AMBULATORY_CARE_PROVIDER_SITE_OTHER): Payer: Medicaid Other | Admitting: Advanced Practice Midwife

## 2020-03-16 VITALS — BP 105/69 | HR 90 | Wt 163.0 lb

## 2020-03-16 DIAGNOSIS — O24410 Gestational diabetes mellitus in pregnancy, diet controlled: Secondary | ICD-10-CM

## 2020-03-16 DIAGNOSIS — O0990 Supervision of high risk pregnancy, unspecified, unspecified trimester: Secondary | ICD-10-CM

## 2020-03-16 DIAGNOSIS — O09299 Supervision of pregnancy with other poor reproductive or obstetric history, unspecified trimester: Secondary | ICD-10-CM

## 2020-03-16 NOTE — Patient Instructions (Signed)

## 2020-03-16 NOTE — Progress Notes (Signed)
   PRENATAL VISIT NOTE  Subjective:  Vanessa Molina is a 34 y.o. [id number] at [redacted]w[redacted]d being seen today for ongoing prenatal care.  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  Patient reports no complaints.  Contractions: Not present. Vag. Bleeding: None.  Movement: Present. Denies leaking of fluid.   The following portions of the patient's history were reviewed and updated as appropriate: allergies, current medications, past family history, past medical history, past social history, past surgical history and problem list.   Objective:   Vitals:   03/16/20 1102  BP: 105/69  Pulse: 90  Weight: 163 lb (73.9 kg)    Fetal Status: Fetal Heart Rate (bpm): 145 Fundal Height: 29 cm Movement: Present     General:  Alert, oriented and cooperative. Patient is in no acute distress.  Skin: Skin is warm and dry. No rash noted.   Cardiovascular: Normal heart rate noted  Respiratory: Normal respiratory effort, no problems with respiration noted  Abdomen: Soft, gravid, appropriate for gestational age.  Pain/Pressure: Absent     Pelvic: Cervical exam deferred        Extremities: Normal range of motion.  Edema: None  Mental Status: Normal mood and affect. Normal behavior. Normal judgment and thought content.   Assessment and Plan:  Pregnancy: [id number] at 100w0d 1. Pregnancy with history of neonatal death --Growth scans this pregnancy  2. Diet controlled gestational diabetes mellitus (GDM) in third trimester --Reviewed glucose log.  All fastings and PP wnl.  Discussed option of taking glucose BID, making sure to  collect some fastings, some PP breakfast lunch and dinner each week.  If abnormal numbers, will go back to taking 4 times per day. --Pt to bring log to next visit in 2 weeks.   3. Supervision of high risk pregnancy, antepartum --Anticipatory guidance about next visits/weeks of pregnancy given. --next visit in 2 weeks in office with interpreter --Arabic interpreter used for all communication today  Preterm labor symptoms and general obstetric precautions including but not limited to vaginal bleeding, contractions, leaking of fluid and fetal movement were reviewed in detail with the patient. Please refer to After Visit Summary for other counseling recommendations.   Return in about 2 weeks (around 03/30/2020).  Future Appointments  Date Time Provider Department Center  03/19/2020  3:15 PM [facility] NURSE [facility]  03/19/2020  3:15 PM [facility] US2 [facility]  03/26/2020 10:45 AM [facility] NURSE [facility]  03/26/2020 10:45 AM [facility] US4 [facility]  03/30/2020 10:55 AM [place], Wilmer Floor, CNM [facility] None  04/02/2020 10:45 AM [facility] NURSE [facility]  04/02/2020 10:45 AM [facility] US4 [facility]    Sharen Counter, CNM

## 2020-03-16 NOTE — Progress Notes (Signed)
Patient reports fetal movement, denies pain. Pt states fasting BG today 92, after breakfast 116.

## 2020-03-19 ENCOUNTER — Encounter: Payer: Self-pay | Admitting: *Deleted

## 2020-03-19 ENCOUNTER — Ambulatory Visit: Payer: Medicaid Other | Admitting: *Deleted

## 2020-03-19 ENCOUNTER — Ambulatory Visit (HOSPITAL_COMMUNITY): Payer: Medicaid Other | Attending: Obstetrics and Gynecology

## 2020-03-19 ENCOUNTER — Other Ambulatory Visit: Payer: Self-pay

## 2020-03-19 DIAGNOSIS — O34219 Maternal care for unspecified type scar from previous cesarean delivery: Secondary | ICD-10-CM

## 2020-03-19 DIAGNOSIS — Z3A30 30 weeks gestation of pregnancy: Secondary | ICD-10-CM | POA: Diagnosis not present

## 2020-03-19 DIAGNOSIS — O0990 Supervision of high risk pregnancy, unspecified, unspecified trimester: Secondary | ICD-10-CM

## 2020-03-19 DIAGNOSIS — O09293 Supervision of pregnancy with other poor reproductive or obstetric history, third trimester: Secondary | ICD-10-CM | POA: Diagnosis not present

## 2020-03-19 DIAGNOSIS — O365990 Maternal care for other known or suspected poor fetal growth, unspecified trimester, not applicable or unspecified: Secondary | ICD-10-CM | POA: Insufficient documentation

## 2020-03-19 DIAGNOSIS — O09213 Supervision of pregnancy with history of pre-term labor, third trimester: Secondary | ICD-10-CM | POA: Diagnosis not present

## 2020-03-19 DIAGNOSIS — O09523 Supervision of elderly multigravida, third trimester: Secondary | ICD-10-CM

## 2020-03-19 DIAGNOSIS — O365931 Maternal care for other known or suspected poor fetal growth, third trimester, fetus 1: Secondary | ICD-10-CM | POA: Diagnosis not present

## 2020-03-26 ENCOUNTER — Ambulatory Visit: Payer: Medicaid Other | Admitting: *Deleted

## 2020-03-26 ENCOUNTER — Ambulatory Visit (HOSPITAL_COMMUNITY): Payer: Medicaid Other | Attending: Obstetrics and Gynecology

## 2020-03-26 ENCOUNTER — Other Ambulatory Visit: Payer: Self-pay

## 2020-03-26 ENCOUNTER — Other Ambulatory Visit: Payer: Self-pay | Admitting: *Deleted

## 2020-03-26 VITALS — BP 106/68 | HR 85

## 2020-03-26 DIAGNOSIS — O09523 Supervision of elderly multigravida, third trimester: Secondary | ICD-10-CM

## 2020-03-26 DIAGNOSIS — Z3A31 31 weeks gestation of pregnancy: Secondary | ICD-10-CM | POA: Diagnosis not present

## 2020-03-26 DIAGNOSIS — O0990 Supervision of high risk pregnancy, unspecified, unspecified trimester: Secondary | ICD-10-CM

## 2020-03-26 DIAGNOSIS — O365990 Maternal care for other known or suspected poor fetal growth, unspecified trimester, not applicable or unspecified: Secondary | ICD-10-CM

## 2020-03-26 DIAGNOSIS — O34219 Maternal care for unspecified type scar from previous cesarean delivery: Secondary | ICD-10-CM

## 2020-03-26 DIAGNOSIS — O365930 Maternal care for other known or suspected poor fetal growth, third trimester, not applicable or unspecified: Secondary | ICD-10-CM

## 2020-03-26 DIAGNOSIS — O09213 Supervision of pregnancy with history of pre-term labor, third trimester: Secondary | ICD-10-CM | POA: Diagnosis not present

## 2020-03-26 DIAGNOSIS — O09293 Supervision of pregnancy with other poor reproductive or obstetric history, third trimester: Secondary | ICD-10-CM | POA: Diagnosis not present

## 2020-03-30 ENCOUNTER — Ambulatory Visit (INDEPENDENT_AMBULATORY_CARE_PROVIDER_SITE_OTHER): Payer: Medicaid Other | Admitting: Advanced Practice Midwife

## 2020-03-30 ENCOUNTER — Encounter: Payer: Self-pay | Admitting: Advanced Practice Midwife

## 2020-03-30 ENCOUNTER — Other Ambulatory Visit: Payer: Self-pay

## 2020-03-30 VITALS — BP 106/70 | HR 92 | Wt 163.0 lb

## 2020-03-30 DIAGNOSIS — O0990 Supervision of high risk pregnancy, unspecified, unspecified trimester: Secondary | ICD-10-CM

## 2020-03-30 DIAGNOSIS — Z3A32 32 weeks gestation of pregnancy: Secondary | ICD-10-CM

## 2020-03-30 DIAGNOSIS — O34219 Maternal care for unspecified type scar from previous cesarean delivery: Secondary | ICD-10-CM

## 2020-03-30 DIAGNOSIS — O0993 Supervision of high risk pregnancy, unspecified, third trimester: Secondary | ICD-10-CM

## 2020-03-30 DIAGNOSIS — O24410 Gestational diabetes mellitus in pregnancy, diet controlled: Secondary | ICD-10-CM

## 2020-03-30 NOTE — Progress Notes (Signed)
ROB   CC: None    

## 2020-03-30 NOTE — Progress Notes (Signed)
   PRENATAL VISIT NOTE  Subjective:  Vanessa Molina is a 34 y.o. P5T6144 at [redacted]w[redacted]d being seen today for ongoing prenatal care.  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  Patient reports no complaints.  Contractions: Not present. Vag. Bleeding: None.  Movement: Present. Denies leaking of fluid.   The following portions of the patient's history were reviewed and updated as appropriate: allergies, current medications, past family history, past medical history, past social history, past surgical history and problem list.   Objective:   Vitals:   03/30/20 1114  BP: 106/70  Pulse: 92  Weight: 163 lb (73.9 kg)    Fetal Status: Fetal Heart Rate (bpm): 145   Movement: Present     General:  Alert, oriented and cooperative. Patient is in no acute distress.  Skin: Skin is warm and dry. No rash noted.   Cardiovascular: Normal heart rate noted  Respiratory: Normal respiratory effort, no problems with respiration noted  Abdomen: Soft, gravid, appropriate for gestational age.  Pain/Pressure: Absent     Pelvic: Cervical exam deferred        Extremities: Normal range of motion.  Edema: None  Mental Status: Normal mood and affect. Normal behavior. Normal judgment and thought content.   Assessment and Plan:  Pregnancy: [id number] at [redacted]w[redacted]d 1. Diet controlled gestational diabetes mellitus (GDM) in third trimester --Reviewed glucose log.  Fasting CBG range 87 to 99 with 2 out of 12 values above 95.  PP CBG range 100 to 126 with 3 out of 18 values above 120.  Glucose well controlled, remain on current therapy.    --F/U in 2 weeks, virtual, pt to keep log of foods to review diet choices/make changes as needed.   2. Supervision of high risk pregnancy, antepartum --Hx neonatal death after preterm C/S with first pregnancy followed by 2 term pregnancies with good outcomes.  3. Previous cesarean delivery, antepartum --C/S x 3 with planned repeat  Preterm labor symptoms and general obstetric precautions including but not limited to vaginal bleeding, contractions, leaking of fluid and fetal movement were reviewed in detail with the patient. Please refer to After Visit Summary for other counseling recommendations.   Return in about 2 weeks (around 04/13/2020).  Future Appointments  Date Time Provider Department Center  04/02/2020 10:45 AM [facility] NURSE [facility]  04/02/2020 10:45 AM [facility] US4 [facility]  04/09/2020  3:30 PM [facility] NURSE [facility]  04/09/2020  3:30 PM [facility] US5 [facility]  04/13/2020  2:20 PM Kooistra, Charlesetta Garibaldi, CNM [facility] None  04/16/2020  2:15 PM [facility] NURSE [facility]  04/16/2020  2:15 PM [facility] US2 [facility]    Sharen Counter, CNM

## 2020-04-02 ENCOUNTER — Ambulatory Visit (HOSPITAL_COMMUNITY): Payer: Medicaid Other | Attending: Obstetrics and Gynecology

## 2020-04-02 ENCOUNTER — Other Ambulatory Visit: Payer: Self-pay | Admitting: *Deleted

## 2020-04-02 ENCOUNTER — Ambulatory Visit: Payer: Medicaid Other | Admitting: *Deleted

## 2020-04-02 ENCOUNTER — Encounter: Payer: Self-pay | Admitting: *Deleted

## 2020-04-02 ENCOUNTER — Other Ambulatory Visit: Payer: Self-pay

## 2020-04-02 DIAGNOSIS — O365990 Maternal care for other known or suspected poor fetal growth, unspecified trimester, not applicable or unspecified: Secondary | ICD-10-CM

## 2020-04-02 DIAGNOSIS — Z3A32 32 weeks gestation of pregnancy: Secondary | ICD-10-CM | POA: Diagnosis not present

## 2020-04-02 DIAGNOSIS — O09293 Supervision of pregnancy with other poor reproductive or obstetric history, third trimester: Secondary | ICD-10-CM | POA: Diagnosis not present

## 2020-04-02 DIAGNOSIS — O34219 Maternal care for unspecified type scar from previous cesarean delivery: Secondary | ICD-10-CM

## 2020-04-02 DIAGNOSIS — O365930 Maternal care for other known or suspected poor fetal growth, third trimester, not applicable or unspecified: Secondary | ICD-10-CM

## 2020-04-02 DIAGNOSIS — O0990 Supervision of high risk pregnancy, unspecified, unspecified trimester: Secondary | ICD-10-CM

## 2020-04-02 DIAGNOSIS — O09213 Supervision of pregnancy with history of pre-term labor, third trimester: Secondary | ICD-10-CM

## 2020-04-02 DIAGNOSIS — O09523 Supervision of elderly multigravida, third trimester: Secondary | ICD-10-CM | POA: Diagnosis not present

## 2020-04-09 ENCOUNTER — Other Ambulatory Visit: Payer: Self-pay

## 2020-04-09 ENCOUNTER — Ambulatory Visit: Payer: Medicaid Other | Admitting: *Deleted

## 2020-04-09 ENCOUNTER — Ambulatory Visit: Payer: Medicaid Other | Attending: Obstetrics and Gynecology

## 2020-04-09 DIAGNOSIS — O365930 Maternal care for other known or suspected poor fetal growth, third trimester, not applicable or unspecified: Secondary | ICD-10-CM

## 2020-04-09 DIAGNOSIS — O34219 Maternal care for unspecified type scar from previous cesarean delivery: Secondary | ICD-10-CM

## 2020-04-09 DIAGNOSIS — O09523 Supervision of elderly multigravida, third trimester: Secondary | ICD-10-CM | POA: Diagnosis not present

## 2020-04-09 DIAGNOSIS — O09293 Supervision of pregnancy with other poor reproductive or obstetric history, third trimester: Secondary | ICD-10-CM

## 2020-04-09 DIAGNOSIS — O0990 Supervision of high risk pregnancy, unspecified, unspecified trimester: Secondary | ICD-10-CM | POA: Insufficient documentation

## 2020-04-09 DIAGNOSIS — Z3A33 33 weeks gestation of pregnancy: Secondary | ICD-10-CM | POA: Diagnosis not present

## 2020-04-09 DIAGNOSIS — O365990 Maternal care for other known or suspected poor fetal growth, unspecified trimester, not applicable or unspecified: Secondary | ICD-10-CM

## 2020-04-09 DIAGNOSIS — O09213 Supervision of pregnancy with history of pre-term labor, third trimester: Secondary | ICD-10-CM | POA: Diagnosis not present

## 2020-04-13 ENCOUNTER — Telehealth (INDEPENDENT_AMBULATORY_CARE_PROVIDER_SITE_OTHER): Payer: Medicaid Other | Admitting: Student

## 2020-04-13 DIAGNOSIS — O34219 Maternal care for unspecified type scar from previous cesarean delivery: Secondary | ICD-10-CM | POA: Diagnosis not present

## 2020-04-13 DIAGNOSIS — O24410 Gestational diabetes mellitus in pregnancy, diet controlled: Secondary | ICD-10-CM | POA: Diagnosis not present

## 2020-04-13 DIAGNOSIS — Z3A34 34 weeks gestation of pregnancy: Secondary | ICD-10-CM

## 2020-04-13 DIAGNOSIS — O0993 Supervision of high risk pregnancy, unspecified, third trimester: Secondary | ICD-10-CM | POA: Diagnosis not present

## 2020-04-13 DIAGNOSIS — O0990 Supervision of high risk pregnancy, unspecified, unspecified trimester: Secondary | ICD-10-CM

## 2020-04-13 NOTE — Progress Notes (Signed)
ROB   Pt states this appt is to review blood sugar readings pt has been keeping a log.  Pt notes sugars have been elevated .

## 2020-04-13 NOTE — Progress Notes (Signed)
TELEHEALTH OBSTETRICS VISIT ENCOUNTER NOTE  I connected with Denine Prouty on 04/14/20 at  2:20 PM EDT by telephone at home and verified that I am speaking with the correct person using two identifiers. Radio broadcast assistant used for this encounter.    I discussed the limitations, risks, security and privacy concerns of performing an evaluation and management service by telephone and the availability of in person appointments. I also discussed with the patient that there may be a patient responsible charge related to this service. The patient expressed understanding and agreed to proceed.  Subjective:  Vanessa Molina is a 34 y.o. [id number] at [redacted]w[redacted]d being followed for ongoing prenatal care.  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  Patient reports that she is checking her blood sugar three times a day. Reports that it is normal in the morning. She reports that she had one value above 125 in the past 7 days. She has put them in [facility]. She is also logging her BPs in [place]. Reports fetal movement. Denies any contractions, bleeding or leaking of fluid.   The following portions of the patient's history were reviewed and updated as appropriate: allergies, current medications, past family history, past medical history, past social history, past surgical history and problem list.   Objective:   General:  Alert, oriented and cooperative.   Mental Status: Normal mood and affect perceived. Normal judgment and thought content.  Rest of physical exam deferred due to type of encounter BPs reviewed in [facility] BS  reviewed in [place]. They are as follows:  Fastings: all normal except two values of 91 and 92 Breakfast: not recorded Lunch: three elevated values of 125, 120, 120, otherwise 4 normal values Dinner: One elevated value of 120, otherwise 6 normal values    Assessment and Plan:  Pregnancy: [id number] at [redacted]w[redacted]d 1. Supervision of high risk pregnancy, antepartum -patient needs MD visit next week to check on BS monitoring; patient should start checking after breakfast; sugars overall well controlled.  -patient knows to keep all follow up testing appointments.  -C/S scheduled -BPs normotensive Preterm labor symptoms and general obstetric precautions including but not limited to vaginal bleeding, contractions, leaking of fluid and fetal movement were reviewed in detail with the patient.  I discussed the assessment and treatment plan with the patient. The patient was provided an opportunity to ask questions and all were answered. The patient agreed with the plan and demonstrated an understanding of the instructions. The patient was advised to call back or seek an in-person office evaluation/go to [facility] at [facility] for any urgent or concerning symptoms. Please refer to After Visit Summary for other counseling recommendations.   I provided 20 minutes of non-face-to-face time during this encounter.  Return in about 1 week (around 04/20/2020), or needs MD appt in one week.  Future Appointments  Date Time Provider [place]  04/16/2020  2:15 PM [facility] NURSE [facility]  04/16/2020  2:15 PM [facility] US2 [facility]  04/20/2020  2:30 PM Cherre Blanc, MD [facility] None  04/23/2020  3:00 PM [facility] NURSE [facility]  04/23/2020  3:00 PM [facility] US1 [facility]  04/30/2020  3:00 PM [facility] NURSE  [facility]  04/30/2020  3:00 PM [facility] US1 [facility]  05/07/2020  3:00 PM [facility] NURSE [facility]  05/07/2020  3:00 PM [facility] US1 [facility]    Samara Deist Gena Fray,  CNM [facility] [employer], [facility]

## 2020-04-16 ENCOUNTER — Ambulatory Visit: Payer: Medicaid Other | Attending: Obstetrics and Gynecology

## 2020-04-16 ENCOUNTER — Ambulatory Visit: Payer: Medicaid Other | Admitting: *Deleted

## 2020-04-16 ENCOUNTER — Other Ambulatory Visit: Payer: Self-pay

## 2020-04-16 DIAGNOSIS — O34219 Maternal care for unspecified type scar from previous cesarean delivery: Secondary | ICD-10-CM | POA: Diagnosis not present

## 2020-04-16 DIAGNOSIS — O24410 Gestational diabetes mellitus in pregnancy, diet controlled: Secondary | ICD-10-CM | POA: Diagnosis not present

## 2020-04-16 DIAGNOSIS — Z3A34 34 weeks gestation of pregnancy: Secondary | ICD-10-CM | POA: Diagnosis not present

## 2020-04-16 DIAGNOSIS — O365930 Maternal care for other known or suspected poor fetal growth, third trimester, not applicable or unspecified: Secondary | ICD-10-CM | POA: Diagnosis not present

## 2020-04-16 DIAGNOSIS — O0990 Supervision of high risk pregnancy, unspecified, unspecified trimester: Secondary | ICD-10-CM | POA: Diagnosis not present

## 2020-04-16 DIAGNOSIS — O09523 Supervision of elderly multigravida, third trimester: Secondary | ICD-10-CM | POA: Diagnosis not present

## 2020-04-16 DIAGNOSIS — O09293 Supervision of pregnancy with other poor reproductive or obstetric history, third trimester: Secondary | ICD-10-CM

## 2020-04-16 DIAGNOSIS — O09213 Supervision of pregnancy with history of pre-term labor, third trimester: Secondary | ICD-10-CM

## 2020-04-16 DIAGNOSIS — O365990 Maternal care for other known or suspected poor fetal growth, unspecified trimester, not applicable or unspecified: Secondary | ICD-10-CM | POA: Diagnosis not present

## 2020-04-20 ENCOUNTER — Encounter: Payer: Self-pay | Admitting: Obstetrics & Gynecology

## 2020-04-20 ENCOUNTER — Ambulatory Visit (INDEPENDENT_AMBULATORY_CARE_PROVIDER_SITE_OTHER): Payer: Medicaid Other | Admitting: Obstetrics & Gynecology

## 2020-04-20 VITALS — BP 110/73 | HR 85 | Wt 172.0 lb

## 2020-04-20 DIAGNOSIS — Z3A35 35 weeks gestation of pregnancy: Secondary | ICD-10-CM

## 2020-04-20 DIAGNOSIS — O24410 Gestational diabetes mellitus in pregnancy, diet controlled: Secondary | ICD-10-CM

## 2020-04-20 NOTE — Progress Notes (Signed)
   PRENATAL VISIT NOTE  Subjective:  Vanessa Molina is a 34 y.o. [id number] at [redacted]w[redacted]d being seen today for ongoing prenatal care.  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  Patient reports no complaints.  Contractions: Not present. Vag. Bleeding: None.  Movement: Present. Denies leaking of fluid.   The following portions of the patient's history were reviewed and updated as appropriate: allergies, current medications, past family history, past medical history, past social history, past surgical history and problem list.   Objective:   Vitals:   04/20/20 1434  BP: 110/73  Pulse: 85  Weight: 172 lb (78 kg)    Fetal Status: Fetal Heart Rate (bpm): 140   Movement: Present     General:  Alert, oriented and cooperative. Patient is in no acute distress.  Skin: Skin is warm and dry. No rash noted.   Cardiovascular: Normal heart rate noted  Respiratory: Normal respiratory effort, no problems with respiration noted  Abdomen: Soft, gravid, appropriate for gestational age.  Pain/Pressure: Absent     Pelvic: Cervical exam deferred        Extremities: Normal range of motion.  Edema: None  Mental Status: Normal mood and affect. Normal behavior. Normal judgment and thought content.   Assessment and Plan:  Pregnancy: [id number] at [redacted]w[redacted]d 1. Diet controlled gestational diabetes mellitus (GDM) in third trimester Well controlled w/ diet. Precautions given. Cultures next visit.   Preterm labor symptoms and general obstetric precautions including but not limited to vaginal bleeding, contractions, leaking of fluid and  fetal movement were reviewed in detail with the patient. Please refer to After Visit Summary for other counseling recommendations.   No follow-ups on file.  Future Appointments  Date Time Provider Department Center  04/23/2020  3:00 PM [facility] NURSE [facility]  04/23/2020  3:00 PM [facility] US1 [facility]  04/27/2020  4:15 PM Warden Fillers, MD [facility] None  04/30/2020  3:00 PM [facility] NURSE [facility]  04/30/2020  3:00 PM [facility] US1 [facility]  05/07/2020  3:00 PM [facility] NURSE [facility]  05/07/2020  3:00 PM [facility] US1 [facility]    Malachy Chamber, MD

## 2020-04-20 NOTE — Patient Instructions (Signed)

## 2020-04-23 ENCOUNTER — Ambulatory Visit: Payer: Medicaid Other | Admitting: *Deleted

## 2020-04-23 ENCOUNTER — Other Ambulatory Visit: Payer: Self-pay

## 2020-04-23 ENCOUNTER — Ambulatory Visit: Payer: Medicaid Other | Attending: Obstetrics and Gynecology

## 2020-04-23 DIAGNOSIS — O09213 Supervision of pregnancy with history of pre-term labor, third trimester: Secondary | ICD-10-CM | POA: Diagnosis not present

## 2020-04-23 DIAGNOSIS — O365930 Maternal care for other known or suspected poor fetal growth, third trimester, not applicable or unspecified: Secondary | ICD-10-CM | POA: Diagnosis not present

## 2020-04-23 DIAGNOSIS — Z3A35 35 weeks gestation of pregnancy: Secondary | ICD-10-CM

## 2020-04-23 DIAGNOSIS — O09293 Supervision of pregnancy with other poor reproductive or obstetric history, third trimester: Secondary | ICD-10-CM | POA: Diagnosis not present

## 2020-04-23 DIAGNOSIS — O09523 Supervision of elderly multigravida, third trimester: Secondary | ICD-10-CM | POA: Diagnosis not present

## 2020-04-23 DIAGNOSIS — O34219 Maternal care for unspecified type scar from previous cesarean delivery: Secondary | ICD-10-CM

## 2020-04-23 DIAGNOSIS — O0990 Supervision of high risk pregnancy, unspecified, unspecified trimester: Secondary | ICD-10-CM | POA: Diagnosis not present

## 2020-04-23 DIAGNOSIS — O24410 Gestational diabetes mellitus in pregnancy, diet controlled: Secondary | ICD-10-CM

## 2020-04-23 DIAGNOSIS — O365990 Maternal care for other known or suspected poor fetal growth, unspecified trimester, not applicable or unspecified: Secondary | ICD-10-CM | POA: Diagnosis not present

## 2020-04-27 ENCOUNTER — Ambulatory Visit (INDEPENDENT_AMBULATORY_CARE_PROVIDER_SITE_OTHER): Payer: Medicaid Other | Admitting: Obstetrics and Gynecology

## 2020-04-27 ENCOUNTER — Other Ambulatory Visit (HOSPITAL_COMMUNITY)
Admission: RE | Admit: 2020-04-27 | Discharge: 2020-04-27 | Disposition: A | Discharge status: Home / Self Care | Payer: Medicaid Other | Source: Ambulatory Visit | Attending: Obstetrics and Gynecology | Admitting: Obstetrics and Gynecology

## 2020-04-27 ENCOUNTER — Encounter: Payer: Self-pay | Admitting: Obstetrics and Gynecology

## 2020-04-27 ENCOUNTER — Other Ambulatory Visit: Payer: Self-pay

## 2020-04-27 VITALS — BP 108/73 | HR 80 | Wt 172.1 lb

## 2020-04-27 DIAGNOSIS — Z3A36 36 weeks gestation of pregnancy: Secondary | ICD-10-CM

## 2020-04-27 DIAGNOSIS — O0990 Supervision of high risk pregnancy, unspecified, unspecified trimester: Secondary | ICD-10-CM

## 2020-04-27 DIAGNOSIS — N90810 Female genital mutilation status, unspecified: Secondary | ICD-10-CM

## 2020-04-27 DIAGNOSIS — O34219 Maternal care for unspecified type scar from previous cesarean delivery: Secondary | ICD-10-CM

## 2020-04-27 DIAGNOSIS — O09293 Supervision of pregnancy with other poor reproductive or obstetric history, third trimester: Secondary | ICD-10-CM

## 2020-04-27 DIAGNOSIS — O0993 Supervision of high risk pregnancy, unspecified, third trimester: Secondary | ICD-10-CM

## 2020-04-27 DIAGNOSIS — Z789 Other specified health status: Secondary | ICD-10-CM

## 2020-04-27 DIAGNOSIS — O24410 Gestational diabetes mellitus in pregnancy, diet controlled: Secondary | ICD-10-CM

## 2020-04-27 DIAGNOSIS — O3483 Maternal care for other abnormalities of pelvic organs, third trimester: Secondary | ICD-10-CM

## 2020-04-27 DIAGNOSIS — Z8759 Personal history of other complications of pregnancy, childbirth and the puerperium: Secondary | ICD-10-CM

## 2020-04-27 DIAGNOSIS — O09299 Supervision of pregnancy with other poor reproductive or obstetric history, unspecified trimester: Secondary | ICD-10-CM

## 2020-04-27 NOTE — Progress Notes (Signed)
   PRENATAL VISIT NOTE  Subjective:  Vanessa Molina is a 34 y.o. [id number] at [redacted]w[redacted]d being seen today for ongoing prenatal care.  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  Patient doing well with no acute concerns today. She reports no complaints.  Contractions: Not present. Vag. Bleeding: None.  Movement: Present. Denies leaking of fluid.   The following portions of the patient's history were reviewed and updated as appropriate: allergies, current medications, past family history, past medical history, past social history, past surgical history and problem list. Problem list updated.  Objective:   Vitals:   04/27/20 1636  BP: 108/73  Pulse: 80  Weight: 172 lb 1.6 oz (78.1 kg)    Fetal Status: Fetal Heart Rate (bpm): 141 Fundal Height: 36 cm Movement: Present     General:  Alert, oriented and cooperative. Patient is in no acute distress.  Skin: Skin is warm and dry. No rash noted.   Cardiovascular: Normal heart rate noted  Respiratory: Normal respiratory effort, no problems with respiration noted  Abdomen: Soft, gravid, appropriate for gestational age.  Pain/Pressure: Absent     Pelvic: Cervical exam performed Dilation: Closed Effacement (%): 40 Station: -3  Extremities: Normal range of motion.  Edema: None  Mental Status:  Normal mood and affect. Normal behavior. Normal judgment and thought content.   Assessment and Plan:  Pregnancy: S4H6759 at [redacted]w[redacted]d  1. 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 Language barrier Interpreter present  3. Previous cesarean delivery affecting pregnancy,  antepartum Repeat c/s at 39 weeks per MFM note  4. Supervision of high risk pregnancy, antepartum  - Strep Gp B NAA - Cervicovaginal ancillary only( [place])  5. History of pre-eclampsia in prior pregnancy, currently pregnant BP excellent 108/73  6. Diet controlled gestational diabetes mellitus (GDM) in third trimester Excellent blood sugar control, sugars reviewed FBS in 80s, postprandial100-118  7. Pregnancy with history of neonatal death Doppler per MFM  Term labor symptoms and general obstetric precautions including but not limited to vaginal bleeding, contractions, leaking of fluid and fetal movement were reviewed in detail with the patient.  Please refer to After Visit Summary for other counseling recommendations.   Return in about 1 week (around 05/04/2020) for [facility], virtual.   Mariel Aloe, MD

## 2020-04-27 NOTE — Patient Instructions (Signed)
Type 1 or Type 2 Diabetes Mellitus During Pregnancy, Self Care When you have type 1 or type 2 diabetes (diabetes mellitus), you must keep your blood sugar (glucose) in a healthy range. You can do this with:  Nutrition.  Exercise.  Lifestyle changes.  Insulin or medicines, if needed.  Support from your doctors and others. If diabetes is treated, it is unlikely to cause problems for the mother or baby. If it is not treated, it may cause problems that can be harmful to the mother and baby. How to stay aware of blood sugar   Check your blood sugar every day, as often as told.  Call your doctor if your blood sugar is above your goal numbers for 2 tests in a row.  Have your A1c (hemoglobin A1c) level checked at least two times a year. Have it checked more often if your doctor tells you to do that. Your doctor will set personal treatment goals for you. In general, you should have these blood sugar levels:  After not eating for a long time (fasting): 95 mg/dL (5.3 mmol/L).  After meals (postprandial): ? One hour after a meal: at or below 140 mg/dL (7.8 mmol/L). ? Two hours after a meal: at or below 120 mg/dL (6.7 mmol/L).  A1c level: 6-6.5%. How to manage high and low blood sugar Signs of high blood sugar High blood sugar is called hyperglycemia. Know the early signs of high blood sugar. Signs may include:  Feeling: ? Thirsty. ? Hungry. ? Very tired.  Needing to pee (urinate) more than usual.  Blurry vision. Signs of low blood sugar Low blood sugar is called hypoglycemia. This is when blood sugar is at or below 70 mg/dL (3.9 mmol/L). Signs may include:  Feeling: ? Hungry. ? Worried or nervous (anxious). ? Sweaty and clammy. ? Confused. ? Dizzy. ? Sleepy. ? Sick to your stomach (nauseous).  Having: ? A fast heartbeat. ? A headache. ? A change in your vision. ? Tingling or no feeling (numbness) around your mouth, lips, or tongue. ? Jerky movements that you cannot  control (seizure).  Having trouble with: ? Moving (coordination). ? Sleeping. ? Passing out (fainting). ? Getting upset easily (irritability). Treating low blood sugar To treat low blood sugar, eat or drink something sugary right away. If you can think clearly and swallow safely, follow the 15:15 rule:  Take 15 grams of a fast-acting carb (carbohydrate). Some fast-acting carbs are: ? 1 tube of glucose gel. ? 3 sugar tablets (glucose pills). ? 6-8 pieces of hard candy. ? 4 oz (120 mL) of fruit juice. ? 4 oz (120 mL) of regular (not diet) soda.  Check your blood sugar 15 minutes after you take the carb.  If your blood sugar is still at or below 70 mg/dL (3.9 mmol/L), take 15 grams of a carb again.  If your blood sugar does not go above 70 mg/dL (3.9 mmol/L) after 3 tries, get help right away.  After your blood sugar goes back to normal, eat a meal or a snack within 1 hour. Treating very low blood sugar If your blood sugar is at or below 54 mg/dL (3 mmol/L), you have very low blood sugar (severe hypoglycemia). This is an emergency. Do not wait to see if the symptoms will go away. Get medical help right away. Call your local emergency services (911 in the [place] If you have very low blood sugar and you cannot eat or drink, you may need a glucagon shot (injection). A  family member or friend should learn how to check your blood sugar and how to give you a glucagon shot. Ask your doctor if you need to have a glucagon shot kit at home. Follow these instructions at home: Medicine  Take insulin and diabetes medicines as told.  If your doctor says you should take more or less insulin and medicines, do this exactly as told.  Do not run out of insulin or medicines. Having diabetes can put you at risk for other long-term conditions. These include heart disease and kidney disease. Your doctor may prescribe medicines to prevent these problems. Food   Make healthy food choices. These  include: ? Chicken, fish, egg whites, and beans. ? Oats, whole wheat, bulgur, brown rice, quinoa, and millet. ? Fresh fruits and vegetables. ? Low-fat dairy products. ? Nuts, avocado, olive oil, and canola oil.  Meet with a food specialist (dietitian). He or she can help you make an eating plan that is right for you.  Follow instructions from your doctor about what you cannot eat or drink.  Drink enough fluid to keep your pee (urine) pale yellow.  Eat healthy snacks between healthy meals.  Keep track of the carbs you eat. Do this by reading food labels and learning food serving sizes.  Follow your sick day plan when you cannot eat or drink normally. Make this plan with your doctor so it is ready to use. Activity  Exercise for 30 minutes or more a day during your pregnancy or as much as told by your doctor.  Talk with your doctor before you start a new exercise or activity. Your doctor may need to tell you to change: ? How much insulin or medicines you take. ? How much food you eat. Lifestyle  Do not drink alcohol.  Do not use any tobacco products, such as cigarettes, chewing tobacco, and e-cigarettes. If you need help quitting, ask your doctor.  Learn how to deal with stress. If you need help with this, ask your doctor. Body care  Stay up to date with your shots (immunizations).  Get an eye exam during your first trimester.  Check your skin and feet every day. Check for cuts, bruises, redness, blisters, or sores.  Get regular foot exams as told by your doctor.  Brush your teeth and gums two times a day. Floss one or more times a day.  Go to the dentist one or more times every 6 months.  Stay at a healthy weight during your pregnancy. General instructions  Take over-the-counter and prescription medicines only as told by your doctor.  Talk with your doctor about your risk for high blood pressure during pregnancy (preeclampsia or eclampsia).  Share your diabetes care  plan with: ? Your work or school. ? People you live with.  Check your pee for ketones: ? When you are sick. ? As told by your doctor.  Carry a card or wear jewelry that says that you have diabetes.  Keep all follow-up visits with your doctor. This is important. Questions to ask your doctor  Do I need to meet with a diabetes educator?  Where can I find a support group for people with diabetes? Where to find more information To learn more about diabetes, visit:  American Diabetes Association: www.diabetes.org  American Association of Diabetes Educators (AADE): www.diabeteseducator.org Summary  When you have type 1 or type 2 diabetes (diabetes mellitus), you must keep your blood sugar (glucose) in a healthy range.  Check your blood sugar every   day, as often as told.  Take insulin and diabetes medicines as told.  Keep all follow-up visits as told by your doctor. This is important. This information is not intended to replace advice given to you by your health care provider. Make sure you discuss any questions you have with your health care provider. Document Revised: 02/11/2019 Document Reviewed: 11/26/2015 Elsevier Patient Education  2020 [employer].

## 2020-04-27 NOTE — Progress Notes (Signed)
Pt is here for ROB, [redacted]w[redacted]d.  

## 2020-04-29 LAB — CERVICOVAGINAL ANCILLARY ONLY
Chlamydia: NEGATIVE
Comment: NEGATIVE
Comment: NORMAL
Neisseria Gonorrhea: NEGATIVE

## 2020-04-29 LAB — STREP GP B NAA: Strep Gp B NAA: POSITIVE — AB

## 2020-04-30 ENCOUNTER — Ambulatory Visit: Payer: Medicaid Other | Attending: Obstetrics and Gynecology

## 2020-04-30 ENCOUNTER — Other Ambulatory Visit: Payer: Self-pay

## 2020-04-30 ENCOUNTER — Ambulatory Visit: Payer: Medicaid Other | Admitting: *Deleted

## 2020-04-30 DIAGNOSIS — O0990 Supervision of high risk pregnancy, unspecified, unspecified trimester: Secondary | ICD-10-CM

## 2020-04-30 DIAGNOSIS — O34219 Maternal care for unspecified type scar from previous cesarean delivery: Secondary | ICD-10-CM | POA: Diagnosis not present

## 2020-04-30 DIAGNOSIS — O09213 Supervision of pregnancy with history of pre-term labor, third trimester: Secondary | ICD-10-CM

## 2020-04-30 DIAGNOSIS — O09523 Supervision of elderly multigravida, third trimester: Secondary | ICD-10-CM

## 2020-04-30 DIAGNOSIS — O365930 Maternal care for other known or suspected poor fetal growth, third trimester, not applicable or unspecified: Secondary | ICD-10-CM | POA: Diagnosis not present

## 2020-04-30 DIAGNOSIS — O365990 Maternal care for other known or suspected poor fetal growth, unspecified trimester, not applicable or unspecified: Secondary | ICD-10-CM | POA: Diagnosis not present

## 2020-04-30 DIAGNOSIS — O24410 Gestational diabetes mellitus in pregnancy, diet controlled: Secondary | ICD-10-CM

## 2020-04-30 DIAGNOSIS — Z3A36 36 weeks gestation of pregnancy: Secondary | ICD-10-CM | POA: Diagnosis not present

## 2020-04-30 DIAGNOSIS — O09293 Supervision of pregnancy with other poor reproductive or obstetric history, third trimester: Secondary | ICD-10-CM | POA: Diagnosis not present

## 2020-05-04 ENCOUNTER — Other Ambulatory Visit: Payer: Self-pay | Admitting: Family Medicine

## 2020-05-05 ENCOUNTER — Telehealth (INDEPENDENT_AMBULATORY_CARE_PROVIDER_SITE_OTHER): Payer: Medicaid Other | Admitting: Obstetrics and Gynecology

## 2020-05-05 VITALS — BP 110/78 | HR 74

## 2020-05-05 DIAGNOSIS — O0993 Supervision of high risk pregnancy, unspecified, third trimester: Secondary | ICD-10-CM | POA: Diagnosis not present

## 2020-05-05 DIAGNOSIS — O24410 Gestational diabetes mellitus in pregnancy, diet controlled: Secondary | ICD-10-CM | POA: Diagnosis not present

## 2020-05-05 DIAGNOSIS — O09293 Supervision of pregnancy with other poor reproductive or obstetric history, third trimester: Secondary | ICD-10-CM | POA: Diagnosis not present

## 2020-05-05 DIAGNOSIS — Z3A37 37 weeks gestation of pregnancy: Secondary | ICD-10-CM | POA: Diagnosis not present

## 2020-05-05 DIAGNOSIS — O09299 Supervision of pregnancy with other poor reproductive or obstetric history, unspecified trimester: Secondary | ICD-10-CM

## 2020-05-05 DIAGNOSIS — Z603 Acculturation difficulty: Secondary | ICD-10-CM

## 2020-05-05 DIAGNOSIS — O0990 Supervision of high risk pregnancy, unspecified, unspecified trimester: Secondary | ICD-10-CM

## 2020-05-05 DIAGNOSIS — Z98891 History of uterine scar from previous surgery: Secondary | ICD-10-CM

## 2020-05-05 DIAGNOSIS — Z789 Other specified health status: Secondary | ICD-10-CM

## 2020-05-05 NOTE — Progress Notes (Signed)
Please discuss Fetal movement at this point in pregnancy.

## 2020-05-05 NOTE — Progress Notes (Addendum)
I connected with [name] on 05/05/20 at  1:45 PM EDT by: Phone and verified that I am speaking with the correct person using two identifiers. Pt was verified at home with interpreter access while MD was located at [facility] for [facility] care at [facility].   The purpose of this virtual visit is to provide medical care while limiting exposure to the novel coronavirus. I discussed the limitations, risks, security and privacy concerns of performing an evaluation and management service by phone call and the availability of in person appointments. I also discussed with the patient that there may be a patient responsible charge related to this service. By engaging in this virtual visit, you consent to the provision of healthcare.  Additionally, you authorize for your insurance to be billed for the services provided during this visit.  The patient expressed understanding and agreed to proceed.  The following staff members participated in the virtual visit:  Marya Landry    PRENATAL VISIT NOTE  Subjective:  Langley Flatley is a 34 y.o. J4H7026 at [redacted]w[redacted]d  for phone visit for ongoing prenatal care.  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  Patient reports no complaints.  Contractions: Not present. Vag. Bleeding: None.  Movement: Present. Denies leaking of fluid.   Per pt both her fasting and postprandial blood sugars were in range after interrogation. We discussed fetal kick counts as well.  The following portions of the patient's history were reviewed and updated as appropriate: allergies, current medications,  past family history, past medical history, past social history, past surgical history and problem list.   Objective:   Vitals:   05/05/20 1353  BP: 110/78  Pulse: 74   Self-Obtained  Fetal Status:     Movement: Present     Assessment and Plan:  Pregnancy: [id number] at [redacted]w[redacted]d 1. Supervision of high risk pregnancy, antepartum Continue care, in person visit in 1 week  2. Diet controlled gestational diabetes mellitus (GDM) in third trimester Blood sugars in range continue diet control  3. Language barrier Interpreter present  4. Pregnancy with history of neonatal death Pt has BPP scheduled 05/18/23 with delivery on 7/13  5. History of pre-eclampsia in prior pregnancy, currently pregnant BP WNL  Term labor symptoms and general obstetric precautions including but not limited to vaginal bleeding, contractions, leaking of fluid and fetal movement were reviewed in detail with the patient.  Return in about 1 week (around 05/12/2020) for [facility], in person.  Future Appointments  Date Time Provider Department Center  May 17, 2020  3:00 PM [facility] NURSE [facility]  2020/05/17  3:00 PM [facility] US1 [facility]     Time spent on virtual visit: 10 minutes  Warden Fillers, MD

## 2020-05-06 NOTE — Patient Instructions (Signed)
Vanessa Molina  05/06/2020   Your procedure is scheduled on:  05/18/2020  Arrive at 0745 at Entrance C on [employer] at [facility]  and [employer]. You are invited to use the FREE valet parking or use the Visitor's parking deck.  Pick up the phone at the desk and dial [phone].  Call this number if you have problems the morning of surgery: [phone]  Remember:   Do not eat food:(After Midnight) Desps de medianoche.  Do not drink clear liquids: (After Midnight) Desps de medianoche.  Take these medicines the morning of surgery with A SIP OF WATER:  none   Do not wear jewelry, make-up or nail polish.  Do not wear lotions, powders, or perfumes. Do not wear deodorant.  Do not shave 48 hours prior to surgery.  Do not bring valuables to the hospital.  [facility] is not   responsible for any belongings or valuables brought to the hospital.  Contacts, dentures or bridgework may not be worn into surgery.  Leave suitcase in the car. After surgery it may be brought to your room.  For patients admitted to the hospital, checkout time is 11:00 AM the day of              discharge.      Please read over the following fact sheets that you were given:     Preparing for Surgery

## 2020-05-07 ENCOUNTER — Ambulatory Visit: Payer: Medicaid Other | Admitting: *Deleted

## 2020-05-07 ENCOUNTER — Ambulatory Visit: Payer: Medicaid Other | Attending: Family Medicine

## 2020-05-07 ENCOUNTER — Other Ambulatory Visit: Payer: Self-pay

## 2020-05-07 ENCOUNTER — Other Ambulatory Visit: Payer: Self-pay | Admitting: *Deleted

## 2020-05-07 ENCOUNTER — Encounter (HOSPITAL_COMMUNITY): Payer: Self-pay

## 2020-05-07 DIAGNOSIS — O365930 Maternal care for other known or suspected poor fetal growth, third trimester, not applicable or unspecified: Secondary | ICD-10-CM | POA: Diagnosis not present

## 2020-05-07 DIAGNOSIS — O0990 Supervision of high risk pregnancy, unspecified, unspecified trimester: Secondary | ICD-10-CM

## 2020-05-07 DIAGNOSIS — O34219 Maternal care for unspecified type scar from previous cesarean delivery: Secondary | ICD-10-CM

## 2020-05-07 DIAGNOSIS — O365990 Maternal care for other known or suspected poor fetal growth, unspecified trimester, not applicable or unspecified: Secondary | ICD-10-CM

## 2020-05-07 DIAGNOSIS — O09523 Supervision of elderly multigravida, third trimester: Secondary | ICD-10-CM

## 2020-05-07 DIAGNOSIS — Z3A37 37 weeks gestation of pregnancy: Secondary | ICD-10-CM | POA: Diagnosis not present

## 2020-05-07 DIAGNOSIS — O24410 Gestational diabetes mellitus in pregnancy, diet controlled: Secondary | ICD-10-CM

## 2020-05-07 DIAGNOSIS — Z362 Encounter for other antenatal screening follow-up: Secondary | ICD-10-CM

## 2020-05-07 DIAGNOSIS — O09213 Supervision of pregnancy with history of pre-term labor, third trimester: Secondary | ICD-10-CM

## 2020-05-07 DIAGNOSIS — O09293 Supervision of pregnancy with other poor reproductive or obstetric history, third trimester: Secondary | ICD-10-CM

## 2020-05-07 NOTE — Pre-Procedure Instructions (Signed)
Interpreter number [id number] 

## 2020-05-12 ENCOUNTER — Ambulatory Visit: Payer: Medicaid Other | Admitting: *Deleted

## 2020-05-12 ENCOUNTER — Other Ambulatory Visit: Payer: Self-pay

## 2020-05-12 ENCOUNTER — Other Ambulatory Visit: Payer: Self-pay | Admitting: Obstetrics

## 2020-05-12 ENCOUNTER — Ambulatory Visit: Payer: Medicaid Other | Attending: Obstetrics & Gynecology

## 2020-05-12 ENCOUNTER — Ambulatory Visit (HOSPITAL_COMMUNITY): Payer: Medicaid Other

## 2020-05-12 DIAGNOSIS — O09293 Supervision of pregnancy with other poor reproductive or obstetric history, third trimester: Secondary | ICD-10-CM

## 2020-05-12 DIAGNOSIS — O34219 Maternal care for unspecified type scar from previous cesarean delivery: Secondary | ICD-10-CM

## 2020-05-12 DIAGNOSIS — O0990 Supervision of high risk pregnancy, unspecified, unspecified trimester: Secondary | ICD-10-CM | POA: Insufficient documentation

## 2020-05-12 DIAGNOSIS — O24410 Gestational diabetes mellitus in pregnancy, diet controlled: Secondary | ICD-10-CM | POA: Diagnosis not present

## 2020-05-12 DIAGNOSIS — O365930 Maternal care for other known or suspected poor fetal growth, third trimester, not applicable or unspecified: Secondary | ICD-10-CM | POA: Diagnosis not present

## 2020-05-12 DIAGNOSIS — Z362 Encounter for other antenatal screening follow-up: Secondary | ICD-10-CM | POA: Diagnosis not present

## 2020-05-12 DIAGNOSIS — O09213 Supervision of pregnancy with history of pre-term labor, third trimester: Secondary | ICD-10-CM | POA: Diagnosis not present

## 2020-05-12 DIAGNOSIS — Z3A38 38 weeks gestation of pregnancy: Secondary | ICD-10-CM | POA: Diagnosis not present

## 2020-05-12 DIAGNOSIS — O365990 Maternal care for other known or suspected poor fetal growth, unspecified trimester, not applicable or unspecified: Secondary | ICD-10-CM | POA: Insufficient documentation

## 2020-05-12 DIAGNOSIS — O09523 Supervision of elderly multigravida, third trimester: Secondary | ICD-10-CM | POA: Diagnosis not present

## 2020-05-12 NOTE — Procedures (Signed)
Vanessa Molina June 27, 1986 [redacted]w[redacted]d  Fetus A Non-Stress Test Interpretation for 05/12/20  Indication: IUGR  Fetal Heart Rate A Mode: External Baseline Rate (A): 135 bpm Variability: Moderate Accelerations: 15 x 15 Decelerations: None Multiple birth?: No  Uterine Activity Mode: Palpation, Toco Contraction Frequency (min): x2 Contraction Duration (sec): 90-120 Contraction Quality: Mild Resting Tone Palpated: Relaxed Resting Time: Adequate  Interpretation (Fetal Testing) Nonstress Test Interpretation: Reactive Comments: Reviewed tracing with Dr. Grace Bushy

## 2020-05-14 ENCOUNTER — Other Ambulatory Visit: Payer: Self-pay

## 2020-05-14 ENCOUNTER — Inpatient Hospital Stay (HOSPITAL_COMMUNITY)
Admission: AD | Admit: 2020-05-14 | Discharge: 2020-05-17 | DRG: 788 | Disposition: A | Discharge status: Home / Self Care | Payer: Medicaid Other | Attending: Obstetrics & Gynecology | Admitting: Obstetrics & Gynecology

## 2020-05-14 ENCOUNTER — Encounter (HOSPITAL_COMMUNITY): Payer: Self-pay | Admitting: Family Medicine

## 2020-05-14 DIAGNOSIS — Z789 Other specified health status: Secondary | ICD-10-CM | POA: Diagnosis present

## 2020-05-14 DIAGNOSIS — O0990 Supervision of high risk pregnancy, unspecified, unspecified trimester: Secondary | ICD-10-CM

## 2020-05-14 DIAGNOSIS — O34211 Maternal care for low transverse scar from previous cesarean delivery: Secondary | ICD-10-CM | POA: Diagnosis present

## 2020-05-14 DIAGNOSIS — N90810 Female genital mutilation status, unspecified: Secondary | ICD-10-CM | POA: Diagnosis present

## 2020-05-14 DIAGNOSIS — O24410 Gestational diabetes mellitus in pregnancy, diet controlled: Secondary | ICD-10-CM | POA: Diagnosis present

## 2020-05-14 DIAGNOSIS — O365930 Maternal care for other known or suspected poor fetal growth, third trimester, not applicable or unspecified: Principal | ICD-10-CM | POA: Diagnosis present

## 2020-05-14 DIAGNOSIS — Z3A38 38 weeks gestation of pregnancy: Secondary | ICD-10-CM

## 2020-05-14 DIAGNOSIS — Z98891 History of uterine scar from previous surgery: Secondary | ICD-10-CM

## 2020-05-14 DIAGNOSIS — Z20822 Contact with and (suspected) exposure to covid-19: Secondary | ICD-10-CM | POA: Diagnosis present

## 2020-05-14 DIAGNOSIS — O09299 Supervision of pregnancy with other poor reproductive or obstetric history, unspecified trimester: Secondary | ICD-10-CM

## 2020-05-14 DIAGNOSIS — O365990 Maternal care for other known or suspected poor fetal growth, unspecified trimester, not applicable or unspecified: Secondary | ICD-10-CM | POA: Diagnosis present

## 2020-05-14 DIAGNOSIS — O24420 Gestational diabetes mellitus in childbirth, diet controlled: Secondary | ICD-10-CM | POA: Diagnosis present

## 2020-05-14 DIAGNOSIS — O3483 Maternal care for other abnormalities of pelvic organs, third trimester: Secondary | ICD-10-CM | POA: Diagnosis present

## 2020-05-14 MED ORDER — FENTANYL CITRATE (PF) 100 MCG/2ML IJ SOLN
100.0000 ug | Freq: Once | INTRAMUSCULAR | Status: AC
  Administered 2020-05-14: 100 ug via INTRAVENOUS
  Filled 2020-05-14: qty 2

## 2020-05-14 MED ORDER — SODIUM CHLORIDE 0.9 % IV BOLUS
1000.0000 mL | Freq: Once | INTRAVENOUS | Status: AC
  Administered 2020-05-14: 1000 mL via INTRAVENOUS

## 2020-05-14 NOTE — MAU Note (Signed)
Pt reports to MAU stating she has a scheduled csection coming soon but today she started to notice ctxs and some vaginal bleeding. Pt reports she notices the bleeding when she wipes but she has a pad on it that is showing just a little brown on the pad. Pt reports ctx are every 10-15 min. No FM since this morning around 0800.

## 2020-05-15 ENCOUNTER — Inpatient Hospital Stay (HOSPITAL_COMMUNITY): Payer: Medicaid Other | Admitting: Anesthesiology

## 2020-05-15 ENCOUNTER — Encounter (HOSPITAL_COMMUNITY): Payer: Self-pay | Admitting: Family Medicine

## 2020-05-15 ENCOUNTER — Encounter (HOSPITAL_COMMUNITY)
Admission: AD | Disposition: A | Discharge status: Home / Self Care | Payer: Self-pay | Source: Home / Self Care | Attending: Obstetrics & Gynecology

## 2020-05-15 DIAGNOSIS — O26893 Other specified pregnancy related conditions, third trimester: Secondary | ICD-10-CM | POA: Diagnosis not present

## 2020-05-15 DIAGNOSIS — O365930 Maternal care for other known or suspected poor fetal growth, third trimester, not applicable or unspecified: Secondary | ICD-10-CM | POA: Diagnosis not present

## 2020-05-15 DIAGNOSIS — Z3A38 38 weeks gestation of pregnancy: Secondary | ICD-10-CM | POA: Diagnosis not present

## 2020-05-15 DIAGNOSIS — N90810 Female genital mutilation status, unspecified: Secondary | ICD-10-CM | POA: Diagnosis not present

## 2020-05-15 DIAGNOSIS — O24420 Gestational diabetes mellitus in childbirth, diet controlled: Secondary | ICD-10-CM | POA: Diagnosis not present

## 2020-05-15 DIAGNOSIS — O365990 Maternal care for other known or suspected poor fetal growth, unspecified trimester, not applicable or unspecified: Secondary | ICD-10-CM | POA: Diagnosis present

## 2020-05-15 DIAGNOSIS — Z20822 Contact with and (suspected) exposure to covid-19: Secondary | ICD-10-CM | POA: Diagnosis not present

## 2020-05-15 DIAGNOSIS — O99824 Streptococcus B carrier state complicating childbirth: Secondary | ICD-10-CM | POA: Diagnosis not present

## 2020-05-15 DIAGNOSIS — O34211 Maternal care for low transverse scar from previous cesarean delivery: Secondary | ICD-10-CM | POA: Diagnosis not present

## 2020-05-15 DIAGNOSIS — O3483 Maternal care for other abnormalities of pelvic organs, third trimester: Secondary | ICD-10-CM | POA: Diagnosis not present

## 2020-05-15 LAB — CBC
HCT: 37.3 % (ref 36.0–46.0)
Hemoglobin: 12.2 g/dL (ref 12.0–15.0)
MCH: 27.8 pg (ref 26.0–34.0)
MCHC: 32.7 g/dL (ref 30.0–36.0)
MCV: 85 fL (ref 80.0–100.0)
Platelets: 243 10*3/uL (ref 150–400)
RBC: 4.39 MIL/uL (ref 3.87–5.11)
RDW: 13.4 % (ref 11.5–15.5)
WBC: 6.9 10*3/uL (ref 4.0–10.5)
nRBC: 0 % (ref 0.0–0.2)

## 2020-05-15 LAB — CREATININE, SERUM
Creatinine, Ser: 0.74 mg/dL (ref 0.44–1.00)
GFR calc Af Amer: >60 mL/min (ref >60)
GFR calc non Af Amer: >60 mL/min (ref >60)

## 2020-05-15 LAB — TYPE AND SCREEN
ABO/RH(D): O POS
Antibody Screen: NEGATIVE

## 2020-05-15 LAB — GLUCOSE, CAPILLARY: Glucose-Capillary: 86 mg/dL (ref 70–99)

## 2020-05-15 LAB — RPR: RPR Ser Ql: NONREACTIVE

## 2020-05-15 LAB — RAPID HIV SCREEN (HIV 1/2 AB+AG)
HIV 1/2 Antibodies: NONREACTIVE
HIV-1 P24 Antigen - HIV24: NONREACTIVE

## 2020-05-15 LAB — SARS CORONAVIRUS 2 BY RT PCR (HOSPITAL ORDER, PERFORMED IN ~~LOC~~ HOSPITAL LAB): SARS Coronavirus 2: NEGATIVE

## 2020-05-15 SURGERY — Surgical Case
Anesthesia: Spinal

## 2020-05-15 MED ORDER — PRENATAL MULTIVITAMIN CH
1.0000 | ORAL_TABLET | Freq: Every day | ORAL | Status: DC
  Administered 2020-05-16 – 2020-05-17 (×2): 1 via ORAL
  Filled 2020-05-15 (×2): qty 1

## 2020-05-15 MED ORDER — MORPHINE SULFATE (PF) 0.5 MG/ML IJ SOLN
INTRAMUSCULAR | Status: DC | PRN
  Administered 2020-05-15: .15 mg via INTRATHECAL

## 2020-05-15 MED ORDER — SODIUM CHLORIDE 0.9 % IV SOLN
INTRAVENOUS | Status: DC | PRN
Start: 2020-05-15 — End: 2020-05-15

## 2020-05-15 MED ORDER — LACTATED RINGERS IV SOLN
INTRAVENOUS | Status: DC | PRN
Start: 2020-05-15 — End: 2020-05-15

## 2020-05-15 MED ORDER — SOD CITRATE-CITRIC ACID 500-334 MG/5ML PO SOLN
ORAL | Status: AC
  Filled 2020-05-15: qty 15

## 2020-05-15 MED ORDER — HYDROMORPHONE HCL 1 MG/ML IJ SOLN: 0.2500 mg | INTRAMUSCULAR | Status: DC | PRN

## 2020-05-15 MED ORDER — ONDANSETRON HCL 4 MG/2ML IJ SOLN
4.0000 mg | Freq: Three times a day (TID) | INTRAMUSCULAR | Status: DC | PRN
  Administered 2020-05-15: 4 mg via INTRAVENOUS
  Filled 2020-05-15: qty 2

## 2020-05-15 MED ORDER — ONDANSETRON HCL 4 MG/2ML IJ SOLN
INTRAMUSCULAR | Status: AC
  Filled 2020-05-15: qty 2

## 2020-05-15 MED ORDER — SIMETHICONE 80 MG PO CHEW
80.0000 mg | CHEWABLE_TABLET | Freq: Three times a day (TID) | ORAL | Status: DC
  Administered 2020-05-15 – 2020-05-17 (×6): 80 mg via ORAL
  Filled 2020-05-15 (×5): qty 1

## 2020-05-15 MED ORDER — DIPHENHYDRAMINE HCL 25 MG PO CAPS: 25.0000 mg | ORAL_CAPSULE | ORAL | Status: DC | PRN

## 2020-05-15 MED ORDER — ACETAMINOPHEN 500 MG PO TABS: 1000.0000 mg | ORAL_TABLET | Freq: Four times a day (QID) | ORAL | Status: DC

## 2020-05-15 MED ORDER — HYDROMORPHONE HCL 1 MG/ML IJ SOLN: 0.2000 mg | INTRAMUSCULAR | Status: DC | PRN

## 2020-05-15 MED ORDER — PROMETHAZINE HCL 25 MG/ML IJ SOLN: 6.2500 mg | INTRAMUSCULAR | Status: DC | PRN

## 2020-05-15 MED ORDER — SIMETHICONE 80 MG PO CHEW
80.0000 mg | CHEWABLE_TABLET | ORAL | Status: DC
  Administered 2020-05-16 – 2020-05-17 (×2): 80 mg via ORAL
  Filled 2020-05-15 (×2): qty 1

## 2020-05-15 MED ORDER — LACTATED RINGERS IV SOLN: INTRAVENOUS | Status: DC

## 2020-05-15 MED ORDER — KETOROLAC TROMETHAMINE 30 MG/ML IJ SOLN
INTRAMUSCULAR | Status: AC
  Filled 2020-05-15: qty 1

## 2020-05-15 MED ORDER — PHENYLEPHRINE HCL-NACL 20-0.9 MG/250ML-% IV SOLN
INTRAVENOUS | Status: DC | PRN
  Administered 2020-05-15: 60 ug/min via INTRAVENOUS

## 2020-05-15 MED ORDER — SODIUM CHLORIDE 0.9% FLUSH: 3.0000 mL | INTRAVENOUS | Status: DC | PRN

## 2020-05-15 MED ORDER — POVIDONE-IODINE 10 % EX SWAB
2.0000 "application " | Freq: Once | CUTANEOUS | Status: AC
  Administered 2020-05-15: 2 via TOPICAL

## 2020-05-15 MED ORDER — CEFAZOLIN SODIUM-DEXTROSE 2-4 GM/100ML-% IV SOLN
2.0000 g | INTRAVENOUS | Status: DC
  Filled 2020-05-15: qty 100

## 2020-05-15 MED ORDER — MEPERIDINE HCL 25 MG/ML IJ SOLN: 6.2500 mg | INTRAMUSCULAR | Status: DC | PRN

## 2020-05-15 MED ORDER — FENTANYL CITRATE (PF) 100 MCG/2ML IJ SOLN
INTRAMUSCULAR | Status: DC | PRN
  Administered 2020-05-15: 15 ug via INTRATHECAL

## 2020-05-15 MED ORDER — KETOROLAC TROMETHAMINE 30 MG/ML IJ SOLN
30.0000 mg | Freq: Four times a day (QID) | INTRAMUSCULAR | Status: AC
  Administered 2020-05-15 – 2020-05-16 (×3): 30 mg via INTRAVENOUS
  Filled 2020-05-15 (×4): qty 1

## 2020-05-15 MED ORDER — IBUPROFEN 800 MG PO TABS
800.0000 mg | ORAL_TABLET | Freq: Four times a day (QID) | ORAL | Status: DC
  Administered 2020-05-17 (×3): 800 mg via ORAL
  Filled 2020-05-15 (×4): qty 1

## 2020-05-15 MED ORDER — SCOPOLAMINE 1 MG/3DAYS TD PT72
1.0000 | MEDICATED_PATCH | Freq: Once | TRANSDERMAL | Status: DC
  Administered 2020-05-15: 1.5 mg via TRANSDERMAL
  Filled 2020-05-15: qty 1

## 2020-05-15 MED ORDER — DIPHENHYDRAMINE HCL 50 MG/ML IJ SOLN: 12.5000 mg | INTRAMUSCULAR | Status: DC | PRN

## 2020-05-15 MED ORDER — PHENYLEPHRINE HCL-NACL 20-0.9 MG/250ML-% IV SOLN
INTRAVENOUS | Status: AC
  Filled 2020-05-15: qty 250

## 2020-05-15 MED ORDER — NALBUPHINE HCL 10 MG/ML IJ SOLN: 5.0000 mg | INTRAMUSCULAR | Status: DC | PRN

## 2020-05-15 MED ORDER — MENTHOL 3 MG MT LOZG: 1.0000 | LOZENGE | OROMUCOSAL | Status: DC | PRN

## 2020-05-15 MED ORDER — SENNOSIDES-DOCUSATE SODIUM 8.6-50 MG PO TABS
2.0000 | ORAL_TABLET | ORAL | Status: DC
  Administered 2020-05-15 – 2020-05-17 (×2): 2 via ORAL
  Filled 2020-05-15 (×2): qty 2

## 2020-05-15 MED ORDER — SIMETHICONE 80 MG PO CHEW
80.0000 mg | CHEWABLE_TABLET | ORAL | Status: DC | PRN
  Administered 2020-05-15: 80 mg via ORAL

## 2020-05-15 MED ORDER — KETOROLAC TROMETHAMINE 30 MG/ML IJ SOLN
30.0000 mg | Freq: Four times a day (QID) | INTRAMUSCULAR | Status: AC | PRN
  Administered 2020-05-15: 30 mg via INTRAMUSCULAR

## 2020-05-15 MED ORDER — NALBUPHINE HCL 10 MG/ML IJ SOLN: 5.0000 mg | Freq: Once | INTRAMUSCULAR | Status: DC | PRN

## 2020-05-15 MED ORDER — ENOXAPARIN SODIUM 40 MG/0.4ML ~~LOC~~ SOLN
40.0000 mg | SUBCUTANEOUS | Status: DC
  Administered 2020-05-16 – 2020-05-17 (×2): 40 mg via SUBCUTANEOUS
  Filled 2020-05-15 (×2): qty 0.4

## 2020-05-15 MED ORDER — OXYCODONE HCL 5 MG PO TABS
5.0000 mg | ORAL_TABLET | ORAL | Status: DC | PRN
  Administered 2020-05-16 – 2020-05-17 (×2): 5 mg via ORAL
  Filled 2020-05-15 (×2): qty 1

## 2020-05-15 MED ORDER — BUPIVACAINE IN DEXTROSE 0.75-8.25 % IT SOLN
INTRATHECAL | Status: DC | PRN
  Administered 2020-05-15: 1.6 mL via INTRATHECAL

## 2020-05-15 MED ORDER — MORPHINE SULFATE (PF) 0.5 MG/ML IJ SOLN
INTRAMUSCULAR | Status: AC
  Filled 2020-05-15: qty 10

## 2020-05-15 MED ORDER — ONDANSETRON HCL 4 MG/2ML IJ SOLN
INTRAMUSCULAR | Status: DC | PRN
  Administered 2020-05-15: 4 mg via INTRAVENOUS

## 2020-05-15 MED ORDER — DIPHENHYDRAMINE HCL 25 MG PO CAPS: 25.0000 mg | ORAL_CAPSULE | Freq: Four times a day (QID) | ORAL | Status: DC | PRN

## 2020-05-15 MED ORDER — OXYTOCIN-SODIUM CHLORIDE 30-0.9 UT/500ML-% IV SOLN
2.5000 [IU]/h | INTRAVENOUS | Status: AC
  Administered 2020-05-15: 2.5 [IU]/h via INTRAVENOUS
  Filled 2020-05-15: qty 500

## 2020-05-15 MED ORDER — LACTATED RINGERS IV SOLN
125.0000 mL/h | INTRAVENOUS | Status: DC
  Administered 2020-05-15 (×2): 125 mL/h via INTRAVENOUS

## 2020-05-15 MED ORDER — OXYTOCIN-SODIUM CHLORIDE 30-0.9 UT/500ML-% IV SOLN
INTRAVENOUS | Status: DC | PRN
  Administered 2020-05-15: 30 [IU] via INTRAVENOUS

## 2020-05-15 MED ORDER — SODIUM CHLORIDE 0.9 % IR SOLN
Status: DC | PRN
  Administered 2020-05-15: 1

## 2020-05-15 MED ORDER — KETOROLAC TROMETHAMINE 30 MG/ML IJ SOLN: 30.0000 mg | Freq: Once | INTRAMUSCULAR | Status: DC | PRN

## 2020-05-15 MED ORDER — FENTANYL CITRATE (PF) 100 MCG/2ML IJ SOLN
INTRAMUSCULAR | Status: AC
  Filled 2020-05-15: qty 2

## 2020-05-15 MED ORDER — ACETAMINOPHEN 500 MG PO TABS
1000.0000 mg | ORAL_TABLET | Freq: Four times a day (QID) | ORAL | Status: DC
  Administered 2020-05-15 – 2020-05-17 (×7): 1000 mg via ORAL
  Filled 2020-05-15 (×7): qty 2

## 2020-05-15 MED ORDER — NALOXONE HCL 0.4 MG/ML IJ SOLN: 0.4000 mg | INTRAMUSCULAR | Status: DC | PRN

## 2020-05-15 MED ORDER — SOD CITRATE-CITRIC ACID 500-334 MG/5ML PO SOLN: 30.0000 mL | Freq: Once | ORAL | Status: AC

## 2020-05-15 MED ORDER — COCONUT OIL OIL: 1.0000 "application " | TOPICAL_OIL | Status: DC | PRN

## 2020-05-15 MED ORDER — KETOROLAC TROMETHAMINE 30 MG/ML IJ SOLN
30.0000 mg | Freq: Four times a day (QID) | INTRAMUSCULAR | Status: AC | PRN
  Administered 2020-05-15 – 2020-05-16 (×2): 30 mg via INTRAVENOUS

## 2020-05-15 MED ORDER — NALOXONE HCL 4 MG/10ML IJ SOLN
1.0000 ug/kg/h | INTRAVENOUS | Status: DC | PRN
  Filled 2020-05-15: qty 5

## 2020-05-15 MED ORDER — CEFAZOLIN SODIUM-DEXTROSE 2-3 GM-%(50ML) IV SOLR
INTRAVENOUS | Status: DC | PRN
Start: 2020-05-15 — End: 2020-05-15
  Administered 2020-05-15: 2 g via INTRAVENOUS

## 2020-05-15 MED ORDER — SOD CITRATE-CITRIC ACID 500-334 MG/5ML PO SOLN
ORAL | Status: AC
  Administered 2020-05-15: 30 mL via ORAL
  Filled 2020-05-15: qty 15

## 2020-05-15 MED ORDER — TETANUS-DIPHTH-ACELL PERTUSSIS 5-2.5-18.5 LF-MCG/0.5 IM SUSP: 0.5000 mL | Freq: Once | INTRAMUSCULAR | Status: DC

## 2020-05-15 MED ORDER — FENTANYL CITRATE (PF) 100 MCG/2ML IJ SOLN
100.0000 ug | INTRAMUSCULAR | Status: DC | PRN
  Administered 2020-05-15: 100 ug via INTRAVENOUS
  Filled 2020-05-15: qty 2

## 2020-05-15 MED ORDER — TERBUTALINE SULFATE 1 MG/ML IJ SOLN
0.2500 mg | Freq: Once | INTRAMUSCULAR | Status: AC
  Administered 2020-05-15: 0.25 mg via SUBCUTANEOUS
  Filled 2020-05-15: qty 1

## 2020-05-15 MED ORDER — KETOROLAC TROMETHAMINE 30 MG/ML IJ SOLN: 30.0000 mg | Freq: Once | INTRAMUSCULAR | Status: DC

## 2020-05-15 MED ORDER — MORPHINE SULFATE (PF) 0.5 MG/ML IJ SOLN: INTRAMUSCULAR | Status: DC | PRN

## 2020-05-15 MED ORDER — ZOLPIDEM TARTRATE 5 MG PO TABS: 5.0000 mg | ORAL_TABLET | Freq: Every evening | ORAL | Status: DC | PRN

## 2020-05-15 SURGICAL SUPPLY — 33 items
BENZOIN TINCTURE PRP APPL 2/3 (GAUZE/BANDAGES/DRESSINGS) ×2 IMPLANT
CHLORAPREP W/TINT 26ML (MISCELLANEOUS) ×2 IMPLANT
CLAMP CORD UMBIL (MISCELLANEOUS) IMPLANT
CLOSURE STERI STRIP 1/2 X4 (GAUZE/BANDAGES/DRESSINGS) ×2 IMPLANT
CLOTH BEACON ORANGE TIMEOUT ST (SAFETY) ×2 IMPLANT
DRSG OPSITE POSTOP 4X10 (GAUZE/BANDAGES/DRESSINGS) ×2 IMPLANT
ELECT REM PT RETURN 9FT ADLT (ELECTROSURGICAL) ×2
ELECTRODE REM PT RTRN 9FT ADLT (ELECTROSURGICAL) ×1 IMPLANT
EXTRACTOR VACUUM M CUP 4 TUBE (SUCTIONS) IMPLANT
GLOVE BIOGEL PI IND STRL 7.0 (GLOVE) ×2 IMPLANT
GLOVE BIOGEL PI IND STRL 7.5 (GLOVE) ×2 IMPLANT
GLOVE BIOGEL PI INDICATOR 7.0 (GLOVE) ×2
GLOVE BIOGEL PI INDICATOR 7.5 (GLOVE) ×2
GLOVE ECLIPSE 7.5 STRL STRAW (GLOVE) ×2 IMPLANT
GOWN STRL REUS W/TWL LRG LVL3 (GOWN DISPOSABLE) ×6 IMPLANT
HEMOSTAT ARISTA ABSORB 3G PWDR (HEMOSTASIS) ×2 IMPLANT
KIT ABG SYR 3ML LUER SLIP (SYRINGE) IMPLANT
NEEDLE HYPO 25X5/8 SAFETYGLIDE (NEEDLE) IMPLANT
NS IRRIG 1000ML POUR BTL (IV SOLUTION) ×2 IMPLANT
PACK C SECTION WH (CUSTOM PROCEDURE TRAY) ×2 IMPLANT
PAD ABD 7.5X8 STRL (GAUZE/BANDAGES/DRESSINGS) ×4 IMPLANT
PAD OB MATERNITY 4.3X12.25 (PERSONAL CARE ITEMS) ×2 IMPLANT
PENCIL SMOKE EVAC W/HOLSTER (ELECTROSURGICAL) ×2 IMPLANT
RTRCTR C-SECT PINK 25CM LRG (MISCELLANEOUS) ×2 IMPLANT
STRIP CLOSURE SKIN 1/2X4 (GAUZE/BANDAGES/DRESSINGS) ×2 IMPLANT
SUT VIC AB 0 CTX 36 (SUTURE) ×3
SUT VIC AB 0 CTX36XBRD ANBCTRL (SUTURE) ×3 IMPLANT
SUT VIC AB 2-0 CT1 27 (SUTURE) ×1
SUT VIC AB 2-0 CT1 TAPERPNT 27 (SUTURE) ×1 IMPLANT
SUT VIC AB 4-0 KS 27 (SUTURE) ×2 IMPLANT
TOWEL OR 17X24 6PK STRL BLUE (TOWEL DISPOSABLE) ×2 IMPLANT
TRAY FOLEY W/BAG SLVR 14FR LF (SET/KITS/TRAYS/PACK) ×2 IMPLANT
WATER STERILE IRR 1000ML POUR (IV SOLUTION) ×2 IMPLANT

## 2020-05-15 NOTE — Op Note (Signed)
Vanessa Molina PROCEDURE DATE: 05/15/2020  PREOPERATIVE DIAGNOSES: Intrauterine pregnancy at [redacted]w[redacted]d weeks gestation; early labor;  previous uterine incision kerr x3 or greater  POSTOPERATIVE DIAGNOSES: The same  PROCEDURE: Repeat Low Transverse Cesarean Section  SURGEON:  Dr. Raynelle Dick  ASSISTANT:  Dr. Dewitt Rota  ANESTHESIOLOGY TEAM: Anesthesiologist: Leilani Able, MD CRNA: Angela Adam, CRNA  INDICATIONS: Vanessa Molina is a 34 y.o. [phone] at [redacted]w[redacted]d here for cesarean section secondary to the indications listed under preoperative diagnoses; please see preoperative note for further details.  The risks of surgery were discussed with the patient including but were not limited to: bleeding which may require transfusion or reoperation; infection which may require antibiotics; injury to bowel, bladder, ureters or other surrounding organs; injury to the fetus; need for additional procedures including hysterectomy in the event of a life-threatening hemorrhage; formation of adhesions; placental abnormalities wth subsequent pregnancies; incisional problems; thromboembolic phenomenon and other postoperative/anesthesia complications.  The patient concurred with the proposed plan, giving informed written consent for the procedure.    FINDINGS:  Viable female infant in cephalic presentation.  Apgars 9 and 9.  Light meconium amniotic fluid.  Intact placenta, three vessel cord.  Adhesive disease involving rectus fascia. Normal uterus, fallopian tubes and ovaries bilaterally.  ANESTHESIA: Spinal INTRAVENOUS FLUIDS: 1200 ml   ESTIMATED BLOOD LOSS: 500 ml URINE OUTPUT:  300 ml SPECIMENS: Placenta sent to L&D COMPLICATIONS: None immediate  PROCEDURE IN DETAIL:  The patient preoperatively received intravenous antibiotics and had sequential compression devices applied to her lower extremities.  She was then taken to the operating room where spinal anesthesia was administered and was found to be  adequate. She was then placed in a dorsal supine position with a leftward tilt, and prepped and draped in a sterile manner.  A foley catheter was placed into her bladder and attached to constant gravity.  After an adequate timeout was performed, a Pfannenstiel skin incision was made with scalpel on her preexisting scar and carried through to the underlying layer of fascia. The fascia was incised in the midline, and this incision was extended bilaterally using the Mayo scissors.  Kocher clamps were applied to the superior aspect of the fascial incision and the underlying rectus muscles were dissected off bluntly and sharply.  A similar process was carried out on the inferior aspect of the fascial incision. The rectus muscles were separated in the midline and the peritoneum was entered bluntly. The Alexis self-retaining retractor was introduced into the abdominal cavity.  Attention was turned to the lower uterine segment where a low transverse hysterotomy was made with a scalpel and extended bilaterally bluntly.  The infant was successfully delivered, the cord was clamped and cut after one minute, and the infant was handed over to the awaiting neonatology team. Uterine massage was then administered, and the placenta delivered intact with a three-vessel cord. The uterus was then cleared of clots and debris.  The hysterotomy was closed with 0 Vicryl in a running locked fashion, and an imbricating layer was also placed with 0 Vicryl.  0 Vicryl serosal compression stitch was placed to help with hemostasis.  Arista was placed over the closed uterine incision.  The pelvis was cleared of all clot and debris. Hemostasis was confirmed on all surfaces.  The retractor was removed.  The fascia was then closed using 0 Vicryl in a running fashion.  The subcutaneous layer was examined and hemostatic after cautery.  The skin was closed with a 4-0 Vicryl subcuticular stitch. The patient  tolerated the procedure well. Sponge, instrument  and needle counts were correct x 3.  She was taken to the recovery room in stable condition.    Raynelle Dick, MD, FACOG Obstetrician & Gynecologist, [facility] for [facility], [facility]

## 2020-05-15 NOTE — H&P (Signed)
OBSTETRIC ADMISSION HISTORY AND PHYSICAL  Entire encounter performed with Arabic interpreter  Vanessa Molina is a 34 y.o. female [phone] with IUP at 82w4dpresenting for early labor. She reports +FMs. No LOF, VB, blurry vision, headaches, peripheral edema, or RUQ pain. She plans on breastfeeding. She requests condoms for birth control.  Dating: By LMP c/w 19 week [place]--->  Estimated Date of Delivery: 05/25/20  Sono:   '@[redacted]w[redacted]d' , normal anatomy, cephalic presentation, 23500X 4%ile, EFW 5#6 - BPP 8/8 - normal S/D ratio  Prenatal History/Complications: IUGR [id number] Pre-E with preterm delivery and neonatal death- on ASA  Past Medical History: Past Medical History:  Diagnosis Date  . Diabetes mellitus [place] complication [place]   . Gestational diabetes   . Headaches, cluster   . History of pre-eclampsia in prior pregnancy, currently pregnant   . Pregnancy induced hypertension     Past Surgical History: Past Surgical History:  Procedure Laterality Date  . CESAREAN SECTION     x2  . CESAREAN SECTION N/A 05/01/2017   Procedure: REPEAT CESAREAN SECTION;  Surgeon: STruett Mainland DO;  [place]  Service: Obstetrics;  Laterality: N/A;    Obstetrical History: OB History    Gravida  5   Para  3   Term  2   Preterm  1   AB  1   Living  2     SAB  1   TAB  0   Ectopic  0   Multiple  0   Live Births  3           Social History: Social History   Socioeconomic History  . Marital status: Married    Spouse name: Not on file  . Number of children: Not on file  . Years of education: Not on file  . Highest education level: Not on file  Occupational History  . Not on file  Tobacco Use  . Smoking status: Never Smoker  . Smokeless tobacco: Never Used  Vaping Use  . Vaping Use: Never used  Substance and Sexual Activity  . Alcohol use: No  . Drug use: No  . Sexual activity: Yes    Birth control/protection: None  Other Topics Concern  . Not on  file  Social History Narrative   ** Merged History Encounter **       Social Determinants of Health   Financial Resource Strain:   . Difficulty of Paying Living Expenses:   Food Insecurity:   . Worried About RCharity fundraiserin the Last Year:   . RArboriculturistin the Last Year:   Transportation Needs:   . LFilm/video editor(Medical):   .Marland KitchenLack of Transportation (Non-Medical):   Physical Activity:   . Days of Exercise per Week:   . Minutes of Exercise per Session:   Stress:   . Feeling of Stress :   Social Connections:   . Frequency of Communication with Friends and Family:   . Frequency of Social Gatherings with Friends and Family:   . Attends Religious Services:   . Active Member of Clubs or Organizations:   . Attends CArchivistMeetings:   .Marland KitchenMarital Status:     Family History: Family History  Problem Relation Age of Onset  . Diabetes Maternal Aunt   . Diabetes Maternal Uncle   . Hypertension Paternal Aunt   . Hypertension Paternal Uncle     Allergies: No Known Allergies  Medications Prior to  Admission  Medication Sig Dispense Refill Last Dose  . aspirin EC 81 MG tablet Take 1 tablet (81 mg total) by mouth daily. 100 tablet 2 05/13/2020 at Unknown time  . pantoprazole (PROTONIX) 20 MG tablet Take 1 tablet (20 mg total) by mouth daily. 30 tablet 3 05/13/2020 at Unknown time  . Prenatal Vit-Fe Fumarate-FA (PREPLUS) 27-1 MG TABS Take 1 tablet by mouth daily. 30 tablet 13 05/13/2020 at Unknown time  . Accu-Chek Softclix Lancets lancets 1 each by Other route 4 (four) times daily. 100 each 12   . Blood Glucose Monitoring Suppl (ACCU-CHEK GUIDE) w/Device KIT 1 Device by Does not apply route 4 (four) times daily. 1 kit 0   . Blood Pressure Monitoring (BLOOD PRESSURE KIT) DEVI 1 kit by Does not apply route once a week. Check Blood Pressure regularly and record readings into the Babyscripts App.  Large Cuff.  DX O90.0 1 each 0   . clobetasol ointment (TEMOVATE)  2.87 % Apply 1 application topically 2 (two) times daily. (Patient not taking: Reported on 03/16/2020) 30 g 0   . Doxylamine-Pyridoxine (DICLEGIS) 10-10 MG TBEC Take 2 tablets by mouth at bedtime. If symptoms persist, add one tablet in the morning and one in the afternoon (Patient not taking: Reported on 02/03/2020) 100 tablet 5   . glucose blood (ACCU-CHEK GUIDE) test strip Use to check blood sugars four times a day was instructed (Patient not taking: Reported on 04/27/2020) 50 each 12   . ondansetron (ZOFRAN ODT) 4 MG disintegrating tablet Take 1 tablet (4 mg total) by mouth every 6 (six) hours as needed for nausea. (Patient not taking: Reported on 02/03/2020) 20 tablet 0      Review of Systems:  All systems reviewed and negative except as stated in HPI  PE: Blood pressure 139/80, pulse 71, temperature 98.6 F (37 C), temperature source Oral, resp. rate 19, weight 78.3 kg, last menstrual period 08/19/2019, unknown if currently breastfeeding. General appearance: uncomfortable with contractions Lungs: regular rate and effort Heart: regular rate  Abdomen: soft, non-tender Extremities: Homans sign is negative, no sign of DVT Presentation: unsure EFM: 145 bpm, moderate variability, 15x15 accels, occasional decels (earlies, one prolonged in MAU that resolved with position changes) Toco: Contractions irregular, 7-9 minutes Dilation: 1 Effacement (%): 40 Station: -3 Exam by:: Esau Grew, RN  Prenatal labs: ABO, Rh: --/--/O POS (07/09 2238) Antibody: NEG (07/09 2238) Rubella: 14.10 (01/05 1548) RPR: Non Reactive (04/27 0844)  HBsAg: Negative (01/05 1548)  HIV: NON REACTIVE (07/10 0056)  GBS: Positive/-- (06/22 1700)  2 hr GTT 108/170/93  Prenatal Transfer Tool  Maternal Diabetes: Yes:  Diabetes Type:  Diet controlled Genetic Screening: Declined Maternal Ultrasounds/Referrals: IUGR 4% Fetal Ultrasounds or other Referrals:  Referred to Materal Fetal Medicine  Maternal Substance Abuse:   No Significant Maternal Medications:  None Significant Maternal Lab Results: Group B Strep positive  Results for orders placed or performed during the hospital encounter of 05/14/20 (from the past 24 hour(s))  Type and screen   Collection Time: 05/14/20 10:38 PM  Result Value Ref Range   ABO/RH(D) O POS    Antibody Screen NEG    Sample Expiration      05/17/2020,2359 Performed at [facility], [address], [place], [place] [address]   SARS Coronavirus 2 by RT PCR (hospital order, performed in [facility] hospital lab) Nasopharyngeal Nasopharyngeal Swab   Collection Time: 05/15/20 12:54 AM   Specimen: Nasopharyngeal Swab  Result Value Ref Range  SARS Coronavirus 2 NEGATIVE NEGATIVE  CBC   Collection Time: 05/15/20 12:56 AM  Result Value Ref Range   WBC 6.9 4.0 - 10.5 K/uL   RBC 4.39 3.87 - 5.11 MIL/uL   Hemoglobin 12.2 12.0 - 15.0 g/dL   HCT 37.3 36 - 46 %   MCV 85.0 80.0 - 100.0 fL   MCH 27.8 26.0 - 34.0 pg   MCHC 32.7 30.0 - 36.0 g/dL   RDW 13.4 11.5 - 15.5 %   Platelets 243 150 - 400 K/uL   nRBC 0.0 0.0 - 0.2 %  Rapid HIV screen (HIV 1/2 Ab+Ag)   Collection Time: 05/15/20 12:56 AM  Result Value Ref Range   HIV-1 P24 Antigen - HIV24 NON REACTIVE NON REACTIVE   HIV 1/2 Antibodies NON REACTIVE NON REACTIVE   Interpretation (HIV Ag Ab)      A non reactive test result means that HIV 1 or HIV 2 antibodies and HIV 1 p24 antigen were not detected in the specimen.    Patient Active Problem List   Diagnosis Date Noted  . Diet controlled gestational diabetes mellitus (GDM) in third trimester 03/03/2020  . History of pre-eclampsia in prior pregnancy, currently pregnant 11/18/2019  . Supervision of high risk pregnancy, antepartum 11/11/2019  . Previous cesarean delivery affecting pregnancy, antepartum 05/01/2017  . Pregnancy with history of neonatal death Nov 17, 2016  . Language barrier 10/12/2016  . 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 [id number] at 8w4dhere for early labor with persistent painful contractions despite IV fluid bolus. She has a history of 3 prior Cesarean sections.  1. Labor: IV fluid bolus given, no cervical change in MAU, will give terbutaline as her contractions are still painful. IV pain medications PRN 2. Fetal Wellbeing: Cat II, reassuring for moderate variability and presence of accels. IUGR 4% 3. NPO status: Last PO intake was 7 pm, NPO status would have her ready for OR at 0300.  4. History of 3 prior CS: Will schedule for AM repeat CS 2/2 early labor as this will be her 4th.   [facility], repeat CS scheduled for 0845 on 7/10.  The risks of cesarean section were discussed with the patient including but were not limited to: bleeding which may require transfusion or reoperation; infection which may require antibiotics; injury to bowel, bladder, ureters or other surrounding organs; injury to the fetus; need for additional procedures including hysterectomy in the event of a life-threatening hemorrhage; placental abnormalities wth subsequent pregnancies, incisional problems, thromboembolic phenomenon and other postoperative/anesthesia complications. The patient concurred with the proposed plan, giving informed written consent for the procedures.  Patient has been NPO since 7 PM she will remain NPO for procedure. Anesthesia and OR aware.  Preoperative prophylactic antibiotics and SCDs ordered on call to the OR.  To OR when ready.  Asante Blanda L Masiah Woody, DO  05/15/2020, 2:56 AM

## 2020-05-15 NOTE — Discharge Summary (Signed)
Postpartum Discharge Summary    Patient Name: Vanessa Molina DOB: [birth year] MRN: [id number]  Date of admission: 05/14/2020 Delivery date:05/15/2020  Delivering provider: Merilyn Baba  Date of discharge: 05/17/2020  Admitting diagnosis: Cesarean delivery delivered [O82] Intrauterine pregnancy: [redacted]w[redacted]d    Secondary diagnosis:  Active Problems:   Language barrier   Pregnancy with history of neonatal death   History of cesarean delivery   Supervision of high risk pregnancy, antepartum   Diet controlled gestational diabetes mellitus (GDM) in third trimester   IUGR (intrauterine growth restriction) affecting care of mother   Cesarean delivery delivered  Additional problems: Early Labor    Discharge diagnosis: Term Pregnancy Delivered                                              Post partum procedures:None Augmentation: N/A Complications: None  Hospital course: Sceduled C/S   34y.o. yo [id number] [id number] admitted to the hospital 05/14/2020 for scheduled cesarean section with the following indication:Elective Repeat and early labor.Delivery details are as follows:  Membrane Rupture Time/Date: 9:41 AM ,05/15/2020   Delivery Method:C-Section, Low Transverse  Details of operation can be found in separate operative note.  Patient had an uncomplicated postpartum course.  She is ambulating, tolerating a regular diet, passing flatus, and urinating well. Patient is discharged home in stable condition on  05/17/20        Newborn Data: Birth date:05/15/2020  Birth time:9:41 AM  Gender:Female  Living status:Living  Apgars:9 ,9  Weight:2665 g     Magnesium Sulfate received: No BMZ received: No Rhophylac:N/A MMR:N/A T-DaP:Declined Flu: N/A Transfusion:No  Physical exam  Vitals:   05/16/20 0541 05/16/20 1721 05/16/20 2140 05/17/20 0548  BP: 113/84 120/84 118/86 130/81  Pulse: 60 61 60 60  Resp: '18 18 18 18  ' Temp: 98 F (36.7 C) 98.4 F (36.9 C) 98 F (36.7 C) 98 F (36.7 C)  TempSrc:  Oral Oral Oral   SpO2: 100% 99% 99% 100%  Weight:      Height:       General: alert, cooperative and no distress Lochia: appropriate Uterine Fundus: firm Incision: Healing well with no significant drainage, mild strikethrough of honeycomb DVT Evaluation: No evidence of DVT seen on physical exam. No significant calf/ankle edema. Labs: Lab Results  Component Value Date   WBC 6.4 05/16/2020   HGB 9.4 (L) 05/16/2020   HCT 29.3 (L) 05/16/2020   MCV 85.7 05/16/2020   PLT 168 05/16/2020   CMP Latest Ref Rng & Units 05/15/2020  Glucose 65 - 99 mg/dL -  BUN 6 - 20 mg/dL -  Creatinine 0.44 - 1.00 mg/dL 0.74  Sodium 134 - 144 mmol/L -  Potassium 3.5 - 5.2 mmol/L -  Chloride 96 - 106 mmol/L -  CO2 20 - 29 mmol/L -  Calcium 8.7 - 10.2 mg/dL -  Total Protein 6.0 - 8.5 g/dL -  Total Bilirubin 0.0 - 1.2 mg/dL -  Alkaline Phos 39 - 117 IU/L -  AST 0 - 40 IU/L -  ALT 0 - 32 IU/L -   Edinburgh Score: Edinburgh Postnatal Depression Scale Screening Tool 05/17/2020  I have been able to laugh and see the funny side of things. 0  I have looked forward with enjoyment to things. 1  I have blamed myself unnecessarily when things went wrong. 3  I  have been anxious or worried for no good reason. 2  I have felt scared or panicky for no good reason. 2  Things have been getting on top of me. 1  I have been so unhappy that I have had difficulty sleeping. 3  I have felt sad or miserable. 1  I have been so unhappy that I have been crying. 1  The thought of harming myself has occurred to me. 0  Edinburgh Postnatal Depression Scale Total 14     After visit meds:  Allergies as of 05/17/2020   No Known Allergies     Medication List    STOP taking these medications   Accu-Chek Guide test strip Generic drug: glucose blood   Accu-Chek Guide w/Device Kit   Accu-Chek Softclix Lancets lancets   aspirin EC 81 MG tablet   Blood Pressure Kit Devi   clobetasol ointment 0.05 % Commonly known as:  TEMOVATE   Doxylamine-Pyridoxine 10-10 MG Tbec Commonly known as: Diclegis   ondansetron 4 MG disintegrating tablet Commonly known as: Zofran ODT     TAKE these medications   acetaminophen 325 MG tablet Commonly known as: TYLENOL Take 2 tablets (650 mg total) by mouth every 4 (four) hours as needed for mild pain or headache. What changed:   how much to take  when to take this   ferrous sulfate 325 (65 FE) MG tablet Take 1 tablet (325 mg total) by mouth every other day.   ibuprofen 600 MG tablet Commonly known as: ADVIL Take 1 tablet (600 mg total) by mouth every 6 (six) hours as needed for mild pain.   oxyCODONE 5 MG immediate release tablet Commonly known as: Oxy IR/ROXICODONE Take 1 tablet (5 mg total) by mouth every 4 (four) hours as needed for moderate pain.   pantoprazole 20 MG tablet Commonly known as: Protonix Take 1 tablet (20 mg total) by mouth daily.   polyethylene glycol powder 17 GM/SCOOP powder Commonly known as: GLYCOLAX/MIRALAX Take 17 g by mouth daily as needed.   PrePLUS 27-1 MG Tabs Take 1 tablet by mouth daily.        Discharge home in stable condition Infant Feeding: Breast Infant Disposition:home with mother Discharge instruction: per After Visit Summary and Postpartum booklet. Activity: Advance as tolerated. Pelvic rest for 6 weeks.  Diet: routine diet Future Appointments: Future Appointments  Date Time Provider [place]  05/17/2020  3:45 PM Cephas Darby, MD [place] None   Follow up Visit:   Please schedule this patient for a In person postpartum visit in 4 weeks with the following provider: Any provider. Additional Postpartum F/U:2 hour GTT at pp visit, incision check in 1-2 weeks  High risk pregnancy complicated by: GDM, H/o CS x3 Delivery mode:  C-Section, Low Transverse  Anticipated Birth Control:  Condoms   05/17/2020 Clarnce Flock, MD

## 2020-05-15 NOTE — Anesthesia Postprocedure Evaluation (Signed)
Anesthesia Post Note  Patient: Vanessa Molina  Procedure(s) Performed: CESAREAN SECTION (N/A )     Patient location during evaluation: PACU Anesthesia Type: Spinal Level of consciousness: awake Pain management: pain level controlled Vital Signs Assessment: post-procedure vital signs reviewed and stable Respiratory status: spontaneous breathing Cardiovascular status: stable Postop Assessment: no headache, no backache, spinal receding, patient able to bend at knees and no apparent nausea or vomiting Anesthetic complications: no   No complications documented.  Last Vitals:  Vitals:   05/15/20 1101 05/15/20 1116  BP: 117/81 117/82  Pulse: (!) 58 (!) 56  Resp: (!) 23 (!) 24  Temp:    SpO2: 99% 100%    Last Pain:  Vitals:   05/15/20 1130  TempSrc:   PainSc: 0-No pain   Pain Goal: Patients Stated Pain Goal: 3 (05/15/20 0136)  LLE Motor Response: Purposeful movement (05/15/20 1130) LLE Sensation: Numbness (05/15/20 1130) RLE Motor Response: Purposeful movement (05/15/20 1130) RLE Sensation: Numbness (05/15/20 1130)     Epidural/Spinal Function Patient able to flex knees: No (05/15/20 1116), Patient able to lift hips off bed: No (05/15/20 1116), Back pain beyond tenderness at insertion site: No (05/15/20 1116), Progressively worsening motor and/or sensory loss: No (05/15/20 1116), Bowel and/or bladder incontinence post epidural: No (05/15/20 1043)  Caren Macadam

## 2020-05-15 NOTE — Progress Notes (Signed)
RN clarified with MD the possibility of using Cell Saver considering this is fourth cesarean section for this patient.  Patient has hgb of 12,MD reports use of Cell Saver not warranted at this time.

## 2020-05-15 NOTE — Anesthesia Procedure Notes (Signed)
Spinal  Patient location during procedure: OR Start time: 05/15/2020 9:15 AM End time: 05/15/2020 9:18 AM Staffing Performed: anesthesiologist  Anesthesiologist: Leilani Able, MD Preanesthetic Checklist Completed: patient identified, IV checked, site marked, risks and benefits discussed, surgical consent, monitors and equipment checked, pre-op evaluation and timeout performed Spinal Block Patient position: sitting Prep: DuraPrep and site prepped and draped Patient monitoring: continuous pulse ox and blood pressure Approach: midline Location: L3-4 Injection technique: single-shot Needle Needle type: Pencan  Needle gauge: 24 G Needle length: 10 cm Needle insertion depth: 6 cm Assessment Sensory level: T4

## 2020-05-15 NOTE — Anesthesia Preprocedure Evaluation (Addendum)
Anesthesia Evaluation  Patient identified by MRN, date of birth, ID band Patient awake    Reviewed: Allergy & Precautions, H&P , NPO status , Patient's Chart, lab work & pertinent test results  Airway Mallampati: I       Dental no notable dental hx. (+) Teeth Intact   Pulmonary neg pulmonary ROS,    Pulmonary exam normal breath sounds clear to auscultation       Cardiovascular hypertension, negative cardio ROS Normal cardiovascular exam Rhythm:Regular Rate:Normal     Neuro/Psych negative neurological ROS  negative psych ROS   GI/Hepatic Neg liver ROS, GERD  Medicated,  Endo/Other  negative endocrine ROSdiabetes, Gestational  Renal/GU negative Renal ROS     Musculoskeletal   Abdominal Normal abdominal exam  (+)   Peds  Hematology negative hematology ROS (+)   Anesthesia Other Findings   Reproductive/Obstetrics (+) Pregnancy                            Anesthesia Physical Anesthesia Plan  ASA: II  Anesthesia Plan: Spinal   Post-op Pain Management:    Induction:   PONV Risk Score and Plan: 3 and Ondansetron, Dexamethasone and Scopolamine patch - Pre-op  Airway Management Planned: Nasal Cannula and Natural Airway  Additional Equipment:   Intra-op Plan:   Post-operative Plan:   Informed Consent: I have reviewed the patients History and Physical, chart, labs and discussed the procedure including the risks, benefits and alternatives for the proposed anesthesia with the patient or authorized representative who has indicated his/her understanding and acceptance.       Plan Discussed with: CRNA  Anesthesia Plan Comments:        Anesthesia Quick Evaluation

## 2020-05-15 NOTE — Transfer of Care (Signed)
Immediate Anesthesia Transfer of Care Note  Patient: Vanessa Molina  Procedure(s) Performed: CESAREAN SECTION (N/A )  Patient Location: PACU  Anesthesia Type:Spinal  Level of Consciousness: awake and alert   Airway & Oxygen Therapy: Patient Spontanous Breathing  Post-op Assessment: Report given to RN  Post vital signs: Reviewed  Last Vitals:  Vitals Value Taken Time  BP 106/65 05/15/20 1043  Temp 36.4 C 05/15/20 1043  Pulse 58 05/15/20 1044  Resp 16 05/15/20 1044  SpO2 100 % 05/15/20 1044  Vitals shown include unvalidated device data.  Last Pain:  Vitals:   05/15/20 1043  TempSrc: Oral  PainSc:       Patients Stated Pain Goal: 3 (05/15/20 0136)  Complications: No complications documented.

## 2020-05-15 NOTE — Lactation Note (Signed)
This note was copied from a baby's chart. Lactation Consultation Note Mom told RN she doesn't need to see Lactation.  Patient Name: Vanessa Molina [id number] Date: 05/15/2020     Maternal Data    Feeding Feeding Type: Bottle Fed - Formula Nipple Type: Slow - flow  LATCH Score                   Interventions    Lactation Tools Discussed/Used     Consult Status      Karsyn Rochin G 05/15/2020, 10:46 PM

## 2020-05-15 NOTE — Plan of Care (Signed)
Patient prepared for OR. Shaymaa [phone]) used to explain pre procedure process and going to the OR. All questions answered.

## 2020-05-15 NOTE — Progress Notes (Signed)
Verified with interpreter that wants formula and breastfeeding (not DBM).

## 2020-05-16 ENCOUNTER — Other Ambulatory Visit (HOSPITAL_COMMUNITY)
Admission: RE | Admit: 2020-05-16 | Discharge: 2020-05-16 | Disposition: A | Discharge status: Home / Self Care | Payer: Medicaid Other | Source: Ambulatory Visit

## 2020-05-16 HISTORY — DX: Gestational diabetes mellitus in pregnancy, unspecified control: O24.419

## 2020-05-16 LAB — CBC
HCT: 29.3 % — ABNORMAL LOW (ref 36.0–46.0)
Hemoglobin: 9.4 g/dL — ABNORMAL LOW (ref 12.0–15.0)
MCH: 27.5 pg (ref 26.0–34.0)
MCHC: 32.1 g/dL (ref 30.0–36.0)
MCV: 85.7 fL (ref 80.0–100.0)
Platelets: 168 10*3/uL (ref 150–400)
RBC: 3.42 MIL/uL — ABNORMAL LOW (ref 3.87–5.11)
RDW: 13.7 % (ref 11.5–15.5)
WBC: 6.4 10*3/uL (ref 4.0–10.5)
nRBC: 0 % (ref 0.0–0.2)

## 2020-05-16 NOTE — Progress Notes (Signed)
Daily Post Partum Note  05/16/2020 Vanessa Molina is a 34 y.o. [id number]  POD#1 s/p rLTCS @ [redacted]w[redacted]d due to early labor.  Pregnancy c/b GDMa1, FGR 24hr/overnight events:  none  Subjective:  +flatus, taking PO w/o issue and pain controlled. +void  Objective:    Current Vital Signs 24h Vital Sign Ranges  T 98 F (36.7 C) Temp  Avg: 97.8 F (36.6 C)  Min: 97.7 F (36.5 C)  Max: 98 F (36.7 C)  BP 113/84 BP  Min: 112/73  Max: 134/82  HR 60 Pulse  Avg: 61  Min: 60  Max: 65  RR 18 Resp  Avg: 18  Min: 18  Max: 18  SaO2 100 % Room Air SpO2  Avg: 98.7 %  Min: 97 %  Max: 100 %       24 Hour I/O Current Shift I/O  Time Ins Outs 07/10 0701 - 07/11 0700 In: 4562.7 [P.O.:1200; I.V.:3362.7] Out: 1510 [Urine:1150] 07/11 0701 - 07/11 1900 In: -  Out: 650 [Urine:650]    General: NAD Abdomen: +BS, moderately distended. Firm fundus, nttp. Incision w/ c/d/i pressure dressing Skin:  Warm and dry.  Cardiovascular: S1, S2 normal, no murmur, rub or gallop, regular rate and rhythm Respiratory:  Clear to auscultation bilateral. Normal respiratory effort  Medications Current Facility-Administered Medications  Medication Dose Route Frequency Provider Last Rate Last Admin  . acetaminophen (TYLENOL) tablet 1,000 mg  1,000 mg Oral Q6H Sparacino, Hailey L, DO   1,000 mg at 05/16/20 1154  . coconut oil  1 application Topical PRN Sparacino, Hailey L, DO      . diphenhydrAMINE (BENADRYL) injection 12.5 mg  12.5 mg Intravenous Q4H PRN Hatchett, Susann Givens, MD       Or  . diphenhydrAMINE (BENADRYL) capsule 25 mg  25 mg Oral Q4H PRN Hatchett, Susann Givens, MD      . diphenhydrAMINE (BENADRYL) capsule 25 mg  25 mg Oral Q6H PRN Sparacino, Hailey L, DO      . enoxaparin (LOVENOX) injection 40 mg  40 mg Subcutaneous Q24H Sparacino, Hailey L, DO   40 mg at 05/16/20 0750  . HYDROmorphone (DILAUDID) injection 0.2-0.6 mg  0.2-0.6 mg Intravenous Q2H PRN Sparacino, Hailey L, DO      . ibuprofen (ADVIL) tablet 800 mg  800 mg Oral  Q6H Sparacino, Hailey L, DO      . lactated ringers infusion   Intravenous Continuous Sparacino, Hailey L, DO 125 mL/hr at 05/16/20 0638 New Bag at 05/16/20 7412  . menthol-cetylpyridinium (CEPACOL) lozenge 3 mg  1 lozenge Oral Q2H PRN Sparacino, Hailey L, DO      . nalbuphine (NUBAIN) injection 5 mg  5 mg Intravenous Q4H PRN Hatchett, Susann Givens, MD       Or  . nalbuphine (NUBAIN) injection 5 mg  5 mg Subcutaneous Q4H PRN Hatchett, Susann Givens, MD      . nalbuphine (NUBAIN) injection 5 mg  5 mg Intravenous Once PRN Leilani Able, MD       Or  . nalbuphine (NUBAIN) injection 5 mg  5 mg Subcutaneous Once PRN Leilani Able, MD      . naloxone [facility]) injection 0.4 mg  0.4 mg Intravenous PRN Leilani Able, MD       And  . sodium chloride flush (NS) 0.9 % injection 3 mL  3 mL Intravenous PRN Hatchett, Franklin, MD      . naloxone HCl (NARCAN) 2 mg in dextrose 5 % 250 mL infusion  1-4 mcg/kg/hr Intravenous Continuous PRN Hatchett,  Susann Givens, MD      . ondansetron [facility]) injection 4 mg  4 mg Intravenous Q8H PRN Leilani Able, MD   4 mg at 05/15/20 1416  . oxyCODONE (Oxy IR/ROXICODONE) immediate release tablet 5-10 mg  5-10 mg Oral Q4H PRN Sparacino, Hailey L, DO      . prenatal multivitamin tablet 1 tablet  1 tablet Oral Q1200 Sparacino, Hailey L, DO   1 tablet at 05/16/20 1154  . scopolamine (TRANSDERM-SCOP) 1 MG/3DAYS 1.5 mg  1 patch Transdermal Once Leilani Able, MD   1.5 mg at 05/15/20 1416  . senna-docusate (Senokot-S) tablet 2 tablet  2 tablet Oral Q24H Sparacino, Hailey L, DO   2 tablet at 05/15/20 2311  . simethicone (MYLICON) chewable tablet 80 mg  80 mg Oral TID PC Sparacino, Hailey L, DO   80 mg at 05/16/20 1211  . simethicone (MYLICON) chewable tablet 80 mg  80 mg Oral Q24H Sparacino, Hailey L, DO   80 mg at 05/16/20 0057  . simethicone (MYLICON) chewable tablet 80 mg  80 mg Oral PRN Sparacino, Hailey L, DO   80 mg at 05/15/20 1635  . Tdap (BOOSTRIX) injection 0.5 mL   0.5 mL Intramuscular Once Sparacino, Hailey L, DO      . zolpidem (AMBIEN) tablet 5 mg  5 mg Oral QHS PRN Sparacino, Hailey L, DO        Labs:  Recent Labs  Lab 05/15/20 0056 05/16/20 0500  WBC 6.9 6.4  HGB 12.2 9.4*  HCT 37.3 29.3*  PLT 243 168   Recent Labs  Lab 05/15/20 1227  CREATININE 0.74    Assessment & Plan:  Pt doing well *Postpartum/postop: O pos. Breast. Boy, circ yes. Infant having temperature issues so will do tomorrow.   Interpreter used  *Dispo: potentially tomorrow.   Cornelia Copa MD Attending Center for [facility])

## 2020-05-16 NOTE — Progress Notes (Signed)
Use the interperter to ask patient some question.Patient told the interpreter that she wanted formula. States that she does not want to see lactation.Patient denied any pain. Interpreter name was Zeyad  [id number] @ [phone]

## 2020-05-17 ENCOUNTER — Encounter: Payer: Medicaid Other | Admitting: Obstetrics and Gynecology

## 2020-05-17 MED ORDER — OXYCODONE HCL 5 MG PO TABS: 5.0000 mg | ORAL_TABLET | ORAL | 0 refills | Status: AC | PRN

## 2020-05-17 MED ORDER — IBUPROFEN 600 MG PO TABS: 600.0000 mg | ORAL_TABLET | Freq: Four times a day (QID) | ORAL | 0 refills | Status: DC | PRN

## 2020-05-17 MED ORDER — ACETAMINOPHEN 325 MG PO TABS: 650.0000 mg | ORAL_TABLET | ORAL | 0 refills | Status: AC | PRN

## 2020-05-17 MED ORDER — FERROUS SULFATE 325 (65 FE) MG PO TABS: 325.0000 mg | ORAL_TABLET | ORAL | 1 refills | Status: AC

## 2020-05-17 MED ORDER — FERROUS SULFATE 325 (65 FE) MG PO TABS
325.0000 mg | ORAL_TABLET | ORAL | Status: DC
  Administered 2020-05-17: 325 mg via ORAL
  Filled 2020-05-17: qty 1

## 2020-05-17 MED ORDER — POLYETHYLENE GLYCOL 3350 17 GM/SCOOP PO POWD
17.0000 g | Freq: Every day | ORAL | 1 refills | Status: AC | PRN
Start: 2020-05-17 — End: ?

## 2020-05-17 NOTE — Progress Notes (Signed)
POSTPARTUM PROGRESS NOTE  POD #2  Subjective:  Vanessa Molina is a 34 y.o. [id number] s/p rLTCS at [redacted]w[redacted]d.  She reports she doing well. No acute events overnight. She denies any problems with ambulating, voiding or po intake. Denies nausea or vomiting. She has passed flatus. Pain is well controlled.  Lochia is very light.  Objective: Blood pressure 130/81, pulse 60, temperature 98 F (36.7 C), resp. rate 18, height 5\' 4"  (1.626 m), weight 78.3 kg, last menstrual period 08/19/2019, SpO2 100 %, unknown if currently breastfeeding.  Physical Exam:  General: alert, cooperative and no distress Chest: no respiratory distress Heart:regular rate, distal pulses intact Abdomen: soft, nontender,  Uterine Fundus: firm, appropriately tender DVT Evaluation: No calf swelling or tenderness Extremities: no LE edema Skin: warm, dry; w/ honeycomb dressing in place and mild strikethrough  Recent Labs    05/15/20 0056 05/16/20 0500  HGB 12.2 9.4*  HCT 37.3 29.3*    Assessment/Plan: Vanessa Molina is a 34 y.o. 31 s/p RCS x4 at [redacted]w[redacted]d.  POD#2 - Doing welll; pain well controlled. H/H appropriate  Routine postpartum care  OOB, ambulated  Lovenox for VTE prophylaxis Anemia: asymptomatic  Start po ferrous sulfate every other day Contraception: condoms Feeding: breast  Dispo: Plan for discharge POD#3 (per patient preference wants to watch child after circumcision, encouraged to D/c if comfortable in the evening).   LOS: 2 days   [redacted]w[redacted]d, MD/MPH OB Fellow  05/17/2020, 11:10 AM

## 2020-05-17 NOTE — Progress Notes (Signed)
CSW received consult due to score 14 on Edinburgh Depression Screen.    CSW entered the room and congratulated MOB and FOB in the birth of infant. CSW asked MOB if she needed interpretor and FOB reported "if you want to understand her, yes". CSW understanding and set interpretor  up with MOB's permission. CSW asked MOB and FOB if it was okay for female interpretor to be used and asked if it was okay for camera to face CSW or MOB? MOB and FOB reported that it was fine for female interpretor and for camera to face parents. CSW understanding and used interpretor Nabil [phone] to speak with parents. CSW advised MOB and FOB of CSW's role and the reason for CSW coming to visit with them MOB reported "I have been fine". CSW asked MOB if anything has been happening for her the last 7 days and MOB reported "no I am fine". CSW understanding and asked for permission to give information on PPD. MOB was agreeable. CSW provided MOB with SIDS and PPD education. MOB. FOB , and CSW discussed signs and symptoms that MOB should look for in herself in regards to PPD. MOB reported that she understood and expressed that infant would be seen at [facility] fro follow up care. MOB reported that she has "crib" for infant to sleep in once arrived home.   MOB denies to this CSW having any mental health hx or feeling SI or HI. MOB again reported that she is feeling fine.   CSW provided education regarding Baby Blues vs PMADs and provided MOB with resources for mental health follow up.  CSW encouraged MOB to evaluate her mental health throughout the postpartum period with the use of the New Mom Checklist developed by Postpartum Progress as well as the [place] Postnatal Depression Scale and notify a medical professional if symptoms arise.     Vanessa Molina Vanessa Molina, MSW, LCSW [facility] [place] [phone]

## 2020-05-17 NOTE — Discharge Instructions (Signed)

## 2020-05-18 ENCOUNTER — Inpatient Hospital Stay (HOSPITAL_COMMUNITY): Admission: RE | Admit: 2020-05-18 | Payer: Medicaid Other | Source: Home / Self Care | Admitting: Family Medicine

## 2020-05-25 ENCOUNTER — Ambulatory Visit: Payer: Medicaid Other

## 2020-05-26 ENCOUNTER — Ambulatory Visit (INDEPENDENT_AMBULATORY_CARE_PROVIDER_SITE_OTHER): Payer: Medicaid Other

## 2020-05-26 ENCOUNTER — Other Ambulatory Visit: Payer: Self-pay

## 2020-05-26 DIAGNOSIS — Z4889 Encounter for other specified surgical aftercare: Secondary | ICD-10-CM

## 2020-05-26 NOTE — Progress Notes (Signed)
Nurse visit for s/p c/s-L Transv on 05/15/20. The wound is C/D/I. The patient is alerted to watch for any signs of infection (redness, pus, pain, increased swelling or fever) and call if such occurs.   Pt to keep upcoming routine pp visit. Reminded pt to fast at midnight prior to pp 2 gtt appt Pt and pt's partner asked for condoms and advised to abstain from IC until after pp provider visit.

## 2020-05-26 NOTE — Progress Notes (Signed)
Patient was assessed and managed by nursing staff during this encounter. I have reviewed the chart and agree with the documentation and plan. I have also made any necessary editorial changes.  Catalina Antigua, MD 05/26/2020 3:26 PM

## 2020-06-25 ENCOUNTER — Ambulatory Visit (INDEPENDENT_AMBULATORY_CARE_PROVIDER_SITE_OTHER): Payer: Medicaid Other | Admitting: Obstetrics and Gynecology

## 2020-06-25 ENCOUNTER — Other Ambulatory Visit: Payer: Self-pay

## 2020-06-25 ENCOUNTER — Encounter: Payer: Self-pay | Admitting: Obstetrics and Gynecology

## 2020-06-25 ENCOUNTER — Other Ambulatory Visit: Payer: Medicaid Other

## 2020-06-25 DIAGNOSIS — O24410 Gestational diabetes mellitus in pregnancy, diet controlled: Secondary | ICD-10-CM | POA: Diagnosis not present

## 2020-06-25 DIAGNOSIS — Z603 Acculturation difficulty: Secondary | ICD-10-CM

## 2020-06-25 DIAGNOSIS — Z7189 Other specified counseling: Secondary | ICD-10-CM | POA: Diagnosis not present

## 2020-06-25 DIAGNOSIS — O0990 Supervision of high risk pregnancy, unspecified, unspecified trimester: Secondary | ICD-10-CM

## 2020-06-25 DIAGNOSIS — Z9889 Other specified postprocedural states: Secondary | ICD-10-CM

## 2020-06-25 DIAGNOSIS — Z98891 History of uterine scar from previous surgery: Secondary | ICD-10-CM

## 2020-06-25 DIAGNOSIS — Z789 Other specified health status: Secondary | ICD-10-CM

## 2020-06-25 NOTE — Progress Notes (Signed)
Obstetrics/Postpartum Visit  Appointment Date: 06/25/2020  OBGYN Clinic: [facility]  Primary Care Provider: Patient, No Pcp Per  Chief Complaint:  Chief Complaint  Patient presents with  . Postpartum Care    History of Present Illness: Vanessa Molina is a 34 y.o. Asian [id number] (Patient's last menstrual period was 08/19/2019 (exact date).), seen for the above chief complaint. Her past medical history is significant for pre-eclampsia    She is s/p repeat c-section on 05/15/20 at 38 weeks (labor with prior c-section); she was discharged to home on POD#2. Pregnancy complicated by h/o c-section, FGR, GdMA1  Complains of nothing, feels well.  Vaginal bleeding or discharge: No  Breast or formula feeding: breast, no issues Intercourse: Yes  Contraception: declines, using condoms PP depression s/s: No  Any bowel or bladder issues: No  Pap smear: no abnormalities (date: 11/2019)  Review of Systems: Positive for n/a.   Her 12 point review of systems is negative or as noted in the History of Present Illness.  Patient Active Problem List   Diagnosis Date Noted  . IUGR (intrauterine growth restriction) affecting care of mother 05/15/2020  . Cesarean delivery delivered 05/15/2020  . Diet controlled gestational diabetes mellitus (GDM) in third trimester 03/03/2020  . History of pre-eclampsia in prior pregnancy, currently pregnant 11/18/2019  . Supervision of high risk pregnancy, antepartum 11/11/2019  . History of cesarean delivery 05/01/2017  . Pregnancy with history of neonatal death December 02, 2016  . Language barrier 10/12/2016  . 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 Current Outpatient Medications  Medication Sig Dispense Refill  . ferrous sulfate 325 (65 FE) MG tablet Take 1 tablet (325 mg total) by mouth every other day. 30 tablet 1  . Prenatal Vit-Fe Fumarate-FA (PREPLUS) 27-1 MG TABS Take 1 tablet by mouth  daily. 30 tablet 13  . acetaminophen (TYLENOL) 325 MG tablet Take 2 tablets (650 mg total) by mouth every 4 (four) hours as needed for mild pain or headache. (Patient not taking: Reported on 06/25/2020) 30 tablet 0  . ibuprofen (ADVIL) 600 MG tablet Take 1 tablet (600 mg total) by mouth every 6 (six) hours as needed for mild pain. (Patient not taking: Reported on 06/25/2020) 30 tablet 0  . oxyCODONE (OXY IR/ROXICODONE) 5 MG immediate release tablet Take 1 tablet (5 mg total) by mouth every 4 (four) hours as needed for moderate pain. (Patient not taking: Reported on 06/25/2020) 20 tablet 0  . pantoprazole (PROTONIX) 20 MG tablet Take 1 tablet (20 mg total) by mouth daily. (Patient not taking: Reported on 06/25/2020) 30 tablet 3  . polyethylene glycol powder (GLYCOLAX/MIRALAX) 17 GM/SCOOP powder Take 17 g by mouth daily as needed. (Patient not taking: Reported on 06/25/2020) 510 g 1   No current facility-administered medications for this visit.    Allergies Patient has no known allergies.  Physical Exam:  BP 112/76   Pulse 69   Wt 170 lb (77.1 kg)   LMP 08/19/2019 (Exact Date)   BMI 29.18 kg/m  Body mass index is 29.18 kg/m. General appearance: Well nourished, well developed female in no acute distress.  Cardiovascular: regular rate and rhythm Respiratory:  Normal respiratory effort Abdomen: no masses, hernias; diffusely non tender to palpation, non distended, well healed pfannenstiel incision Breasts: not examined. Neuro/Psych:  Normal mood and affect.  Skin:  Warm and dry.   PP Depression Screening:    Edinburgh Postnatal Depression Scale - 06/25/20 7209  Edinburgh Postnatal Depression Scale:  In the Past 7 Days   I have been able to laugh and see the funny side of things. 0    I have looked forward with enjoyment to things. 0    I have blamed myself unnecessarily when things went wrong. 0    I have been anxious or worried for no good reason. 0    I have felt scared or panicky for  no good reason. 0    Things have been getting on top of me. 0    I have been so unhappy that I have had difficulty sleeping. 0    I have felt sad or miserable. 0    I have been so unhappy that I have been crying. 0    The thought of harming myself has occurred to me. 0    Edinburgh Postnatal Depression Scale Total 0           Assessment: Patient is a 34 y.o. [id number] who is 4 weeks post partum from a repeat c-section. She is doing well.   Plan:   1. Postpartum state Doing well  2. Postoperative state  3. Language Radio broadcast assistant used  4. Diet controlled gestational diabetes mellitus (GDM) in third trimester 2 hr GTT today  5. Supervision of high risk pregnancy, antepartum  6. Counseled about COVID-19 virus infection The patient was counseled on the potential benefits and lack of known risks of COVID vaccination, during pregnancy and breastfeeding, on today's visit. The patient's questions and concerns were addressed today, including being unsure. The patient is still unsure of her decision for vaccination.   Essential components of care per ACOG recommendations:  1.  Mood and well being: Patient with negative depression screening today. Reviewed local resources for support.  - Patient does not use tobacco.  - hx of drug use? No    2. Infant care and feeding:  -Patient currently breastmilk feeding? Yes No issues. -Social determinants of health (SDOH) reviewed in EPIC. No concerns  3. Sexuality, contraception and birth spacing - Patient does not want a pregnancy in the next year.  Desired family size is unknown children.  - Reviewed forms of contraception in tiered fashion. Patient desired condoms today.   - Discussed birth spacing of 18 months  4. Sleep and fatigue -Encouraged family/partner/community support of 4 hrs of uninterrupted sleep to help with mood and fatigue  5. Physical Recovery  - Discussed patients delivery and complications - Patient has urinary  incontinence? No  - Patient is safe to resume physical and sexual activity  6.  Health Maintenance - Last pap smear done 11/2019 and was normal with negative HPV.   RTC 6 months for annual   Baldemar Lenis, M.D. Attending Center for [employer] [facility])

## 2020-06-26 LAB — GLUCOSE TOLERANCE, 2 HOURS
Glucose, 2 hour: 109 mg/dL (ref 65–139)
Glucose, GTT - Fasting: 86 mg/dL (ref 65–99)

## 2020-10-26 DIAGNOSIS — A6004 Herpesviral vulvovaginitis: Secondary | ICD-10-CM | POA: Diagnosis not present

## 2020-10-26 DIAGNOSIS — R300 Dysuria: Secondary | ICD-10-CM | POA: Diagnosis not present

## 2020-10-28 ENCOUNTER — Ambulatory Visit: Payer: Medicaid Other | Admitting: Nurse Practitioner

## 2020-11-13 DIAGNOSIS — Z20822 Contact with and (suspected) exposure to covid-19: Secondary | ICD-10-CM | POA: Diagnosis not present

## 2021-03-09 DIAGNOSIS — H1013 Acute atopic conjunctivitis, bilateral: Secondary | ICD-10-CM | POA: Diagnosis not present

## 2021-03-09 DIAGNOSIS — J302 Other seasonal allergic rhinitis: Secondary | ICD-10-CM | POA: Diagnosis not present

## 2021-03-14 DIAGNOSIS — J309 Allergic rhinitis, unspecified: Secondary | ICD-10-CM | POA: Diagnosis not present

## 2021-03-14 DIAGNOSIS — Z8632 Personal history of gestational diabetes: Secondary | ICD-10-CM | POA: Diagnosis not present

## 2021-03-14 DIAGNOSIS — E785 Hyperlipidemia, unspecified: Secondary | ICD-10-CM | POA: Diagnosis not present

## 2021-03-14 DIAGNOSIS — Z0000 Encounter for general adult medical examination without abnormal findings: Secondary | ICD-10-CM | POA: Diagnosis not present

## 2021-12-15 IMAGING — US US MFM OB FOLLOW-UP
1 series · 14 of 28 positions shown · non-contrast
Comparison: none

[Series 1: us mfm ob follow-up · 38 acquisitions, 14 frames shown]
[im 2/38]
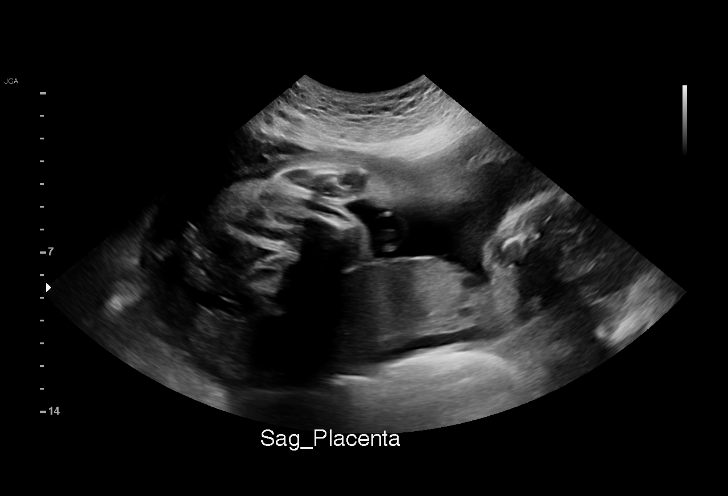
[im 5/38]
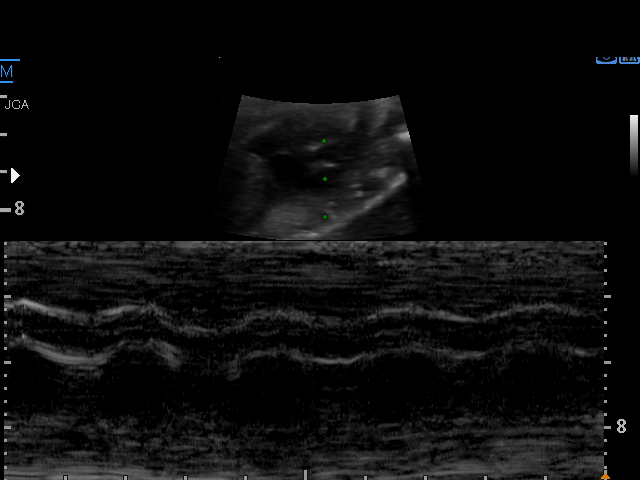
[im 7/38]
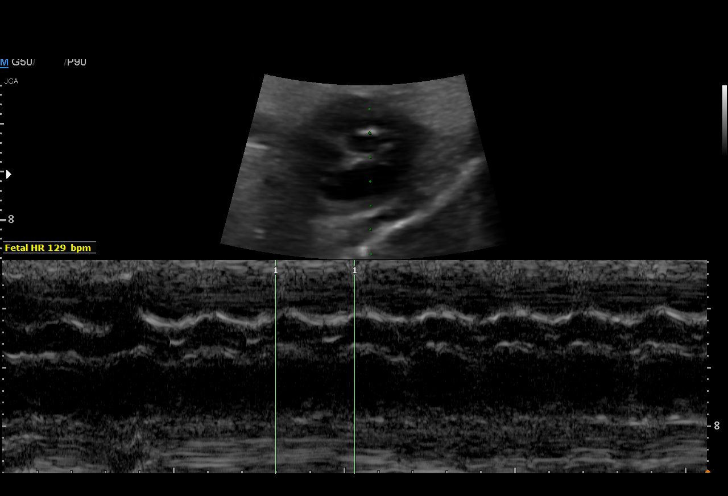
[im 10/38]
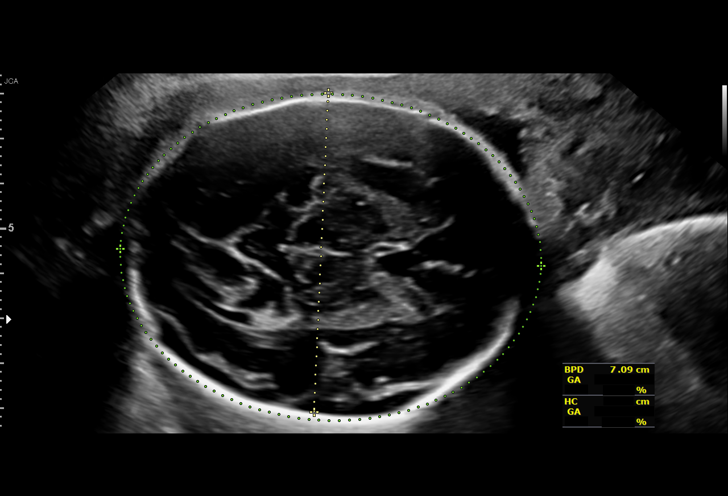
[im 13/38]
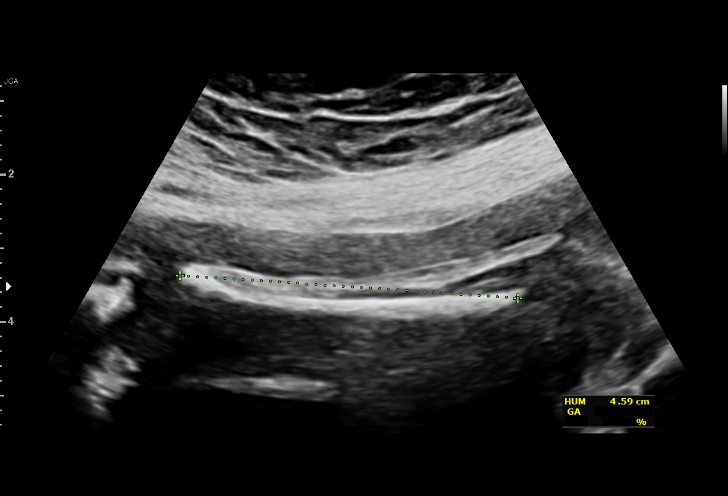
[im 16/38]
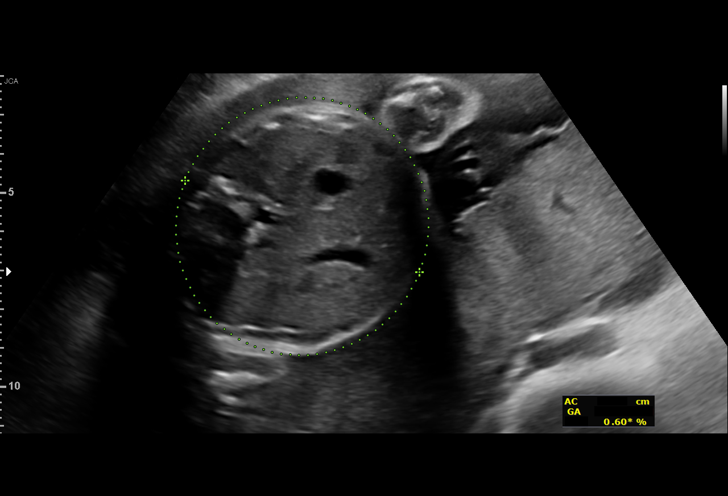
[im 18/38]
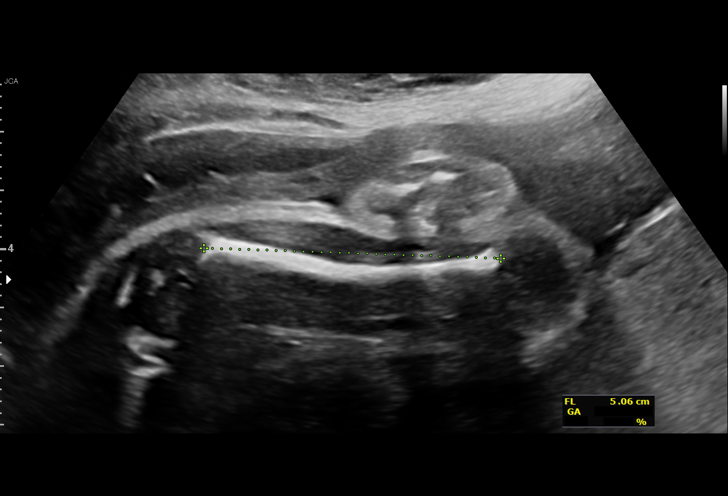
[im 21/38]
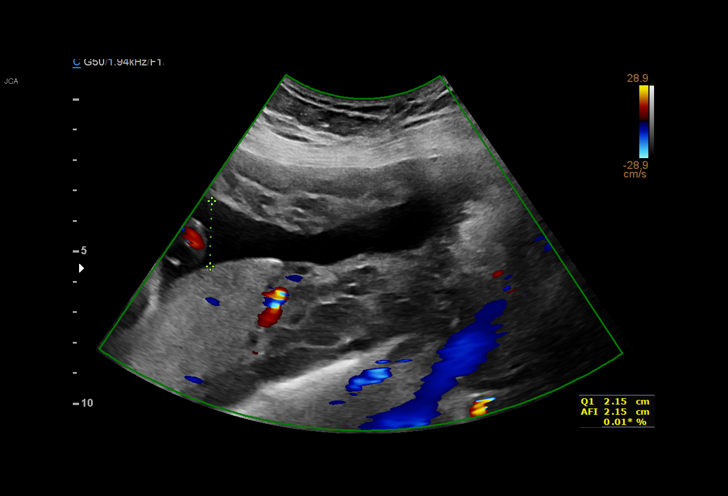
[im 24/38]
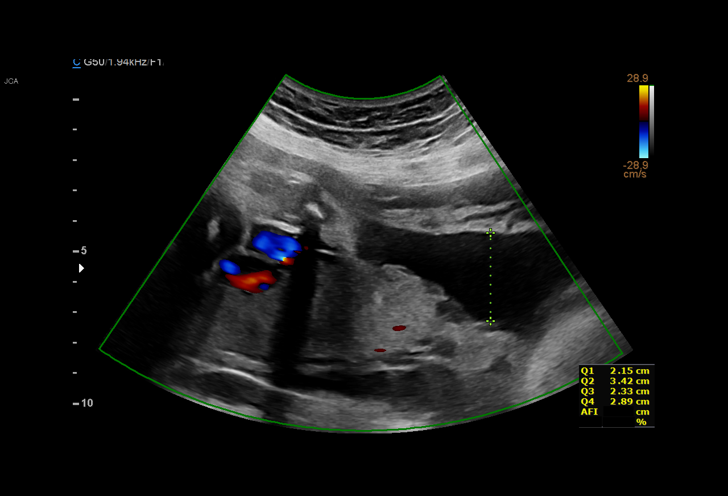
[im 27/38]
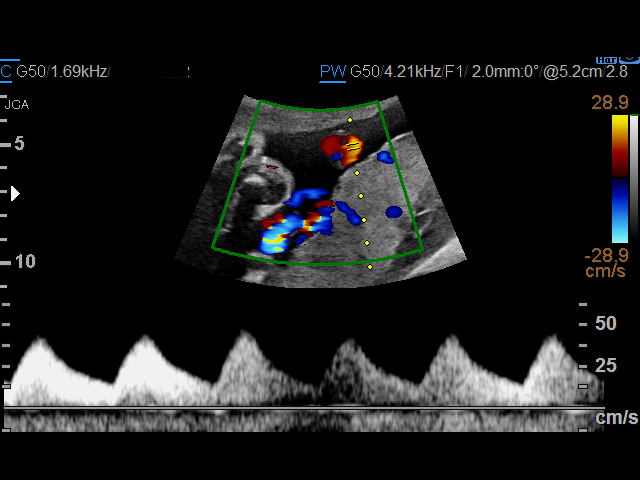
[im 29/38]
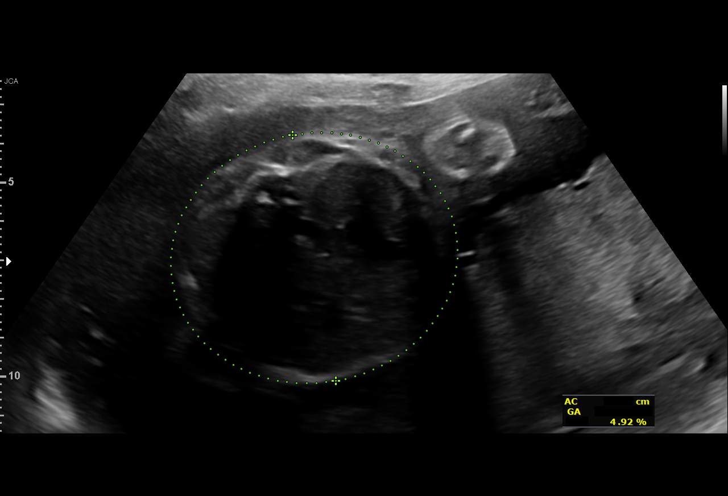
[im 32/38]
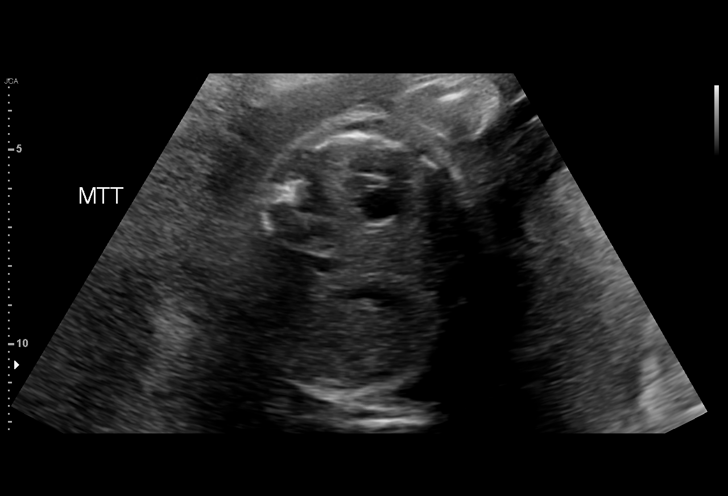
[im 35/38]
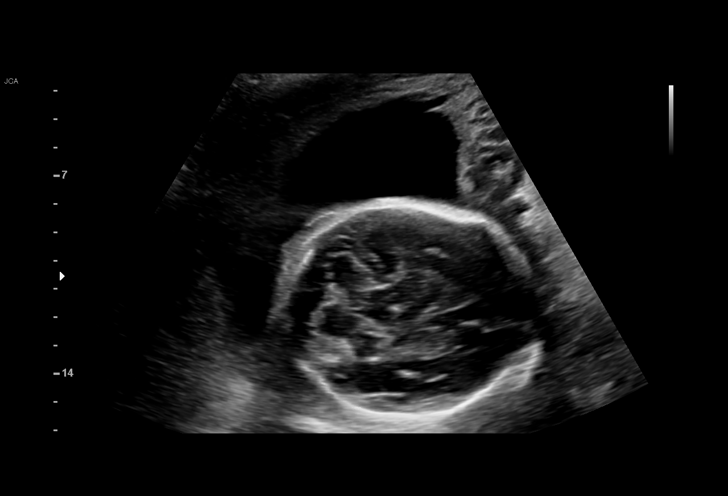
[im 38/38]
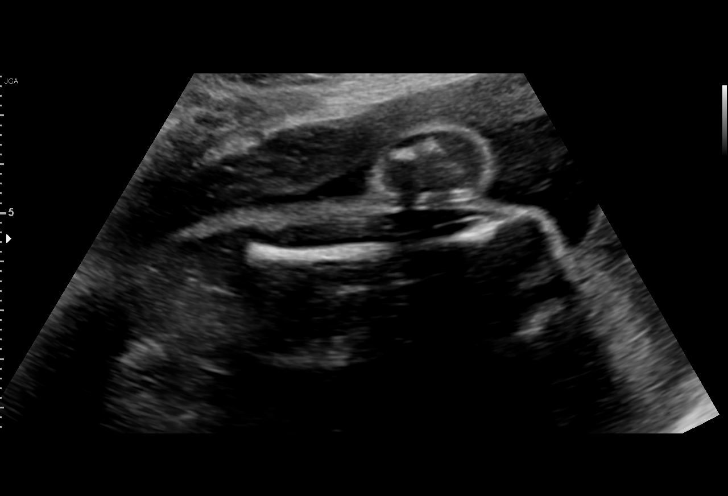

[14 of 28 positions shown; findings below may reference images not displayed]

----------------------------------------------------------------------

 ----------------------------------------------------------------------
Indications

  Advanced maternal age multigravida 35+,
  second trimester
  History of cesarean delivery, currently
  pregnant (x3)
  Poor obstetric history: Previous preterm
  delivery, antepartum (07weeks)
  Poor obstetric history: Previous
  preeclampsia / eclampsia/gestational HTN
  28 weeks gestation of pregnancy
 ----------------------------------------------------------------------
Fetal Evaluation

 Num Of Fetuses:         1
 Fetal Heart Rate(bpm):  128
 Cardiac Activity:       Observed
 Presentation:           Cephalic
 Placenta:               Posterior
 P. Cord Insertion:      Previously Visualized

 Amniotic Fluid
 AFI FV:      Within normal limits

 AFI Sum(cm)     %Tile       Largest Pocket(cm)
 10.79           17

 RUQ(cm)       RLQ(cm)       LUQ(cm)        LLQ(cm)

Biometry
 BPD:      72.1  mm     G. Age:  29w 0d         69  %    CI:        76.48   %    70 - 86
                                                         FL/HC:      19.3   %    18.8 -
 HC:      261.2  mm     G. Age:  28w 3d         31  %    HC/AC:      1.21        1.05 -
 AC:      216.6  mm     G. Age:  26w 1d          4  %    FL/BPD:     70.0   %    71 - 87
 FL:       50.5  mm     G. Age:  27w 1d         14  %    FL/AC:      23.3   %    20 - 24
 HUM:      46.4  mm     G. Age:  27w 2d         32  %
 CER:      32.8  mm     G. Age:  28w 6d         59  %

 Est. FW:     993  gm      2 lb 3 oz      8  %
OB History

 Gravidity:    5         Term:   2        Prem:   1        SAB:   1
 Living:       2
Gestational Age

 LMP:           28w 0d        Date:  08/19/19                 EDD:   05/25/20
 U/S Today:     27w 5d                                        EDD:   05/27/20
 Best:          28w 0d     Det. By:  LMP  (08/19/19)          EDD:   05/25/20
Doppler - Fetal Vessels

 Umbilical Artery
  S/D     %tile                                            ADFV    RDFV
 3.13       56                                                No      No

Impression

 Advanced maternal age.  Patient return for fetal growth
 assessment.
 On ultrasound, the estimated fetal weight is at the 8
 percentile.  Abdominal circumference is at the 4th percentile.
 Head circumference measurements are within normal range.
 Amniotic fluid is normal good fetal activity seen. Umbilical
 artery Doppler showed normal forward diastolic flow.

 Patient reports her first child weighed around 5 pounds in the
 second 6 pounds at birth.

 I explained the finding of fetal growth restriction that is difficult
 to differentiate from a constitutionally small fetus.  I discussed
 the frequency of antenatal testing.  Patient was counseled
 with the help of language interpreter present in the room. She
 reports fasting during Ramadan and would like to know if she
 can continue fasting. She also informed that her religion
 exempts pregnant women from fasting.

 Although I reassured her that fetal growth restriction is more-
 likely from other causes (placental insufficiency), it is
 important that the mother is well nourished to avoid the
 possibility of fetal growth restriction. If her religion exempts
 her from fasting, she should eat regularly.
Recommendations

 -UA Doppler in 2 weeks.
 -Fetal growth and UA Doppler in 3 weeks.
                 [place], Livax

## 2022-01-08 IMAGING — US US MFM UA CORD DOPPLER
1 series · 13 of 28 positions shown · non-contrast
Comparison: none

[Series 1: us mfm ua cord doppler · 43 acquisitions, 13 frames shown]
[im 2/43]
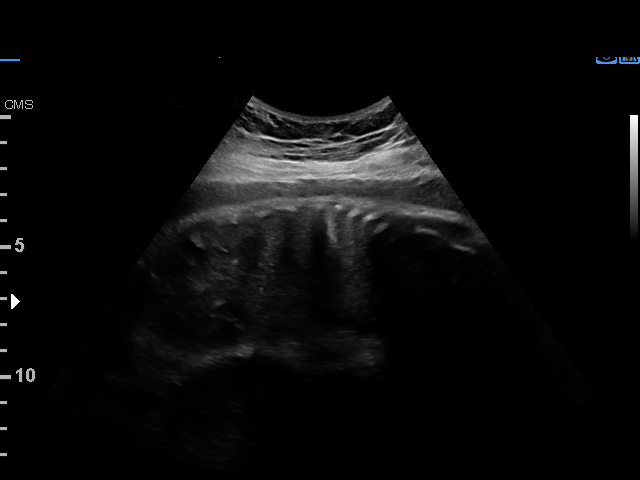
[im 5/43]
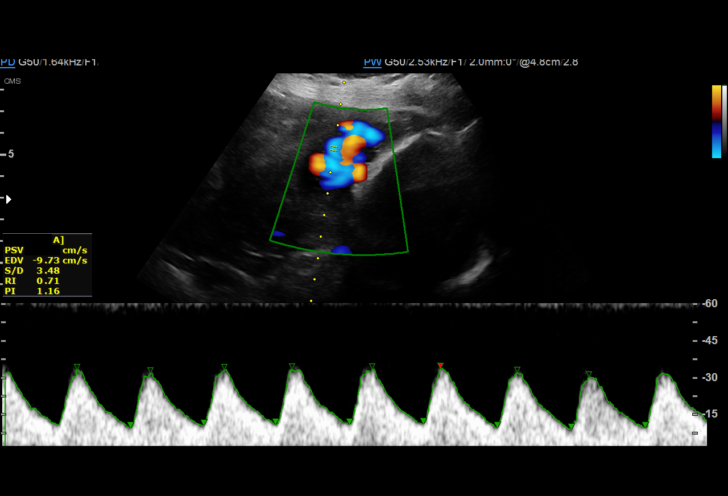
[im 8/43]
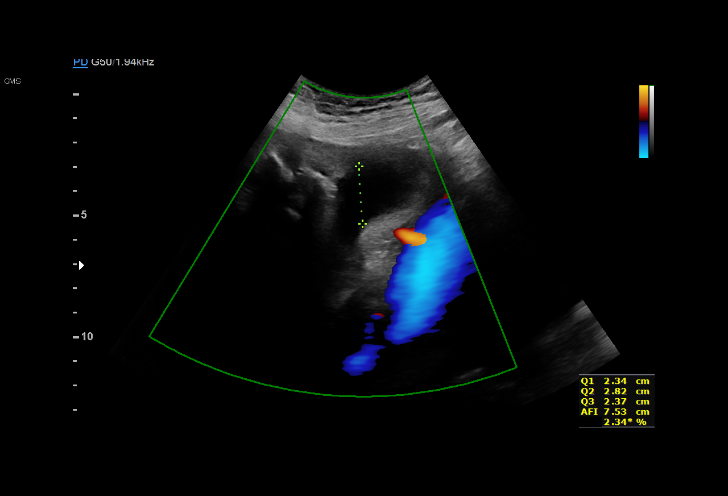
[im 11/43]
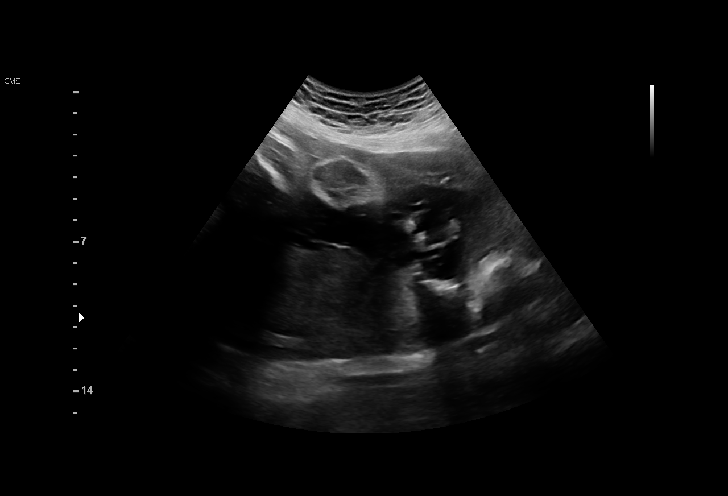
[im 15/43]
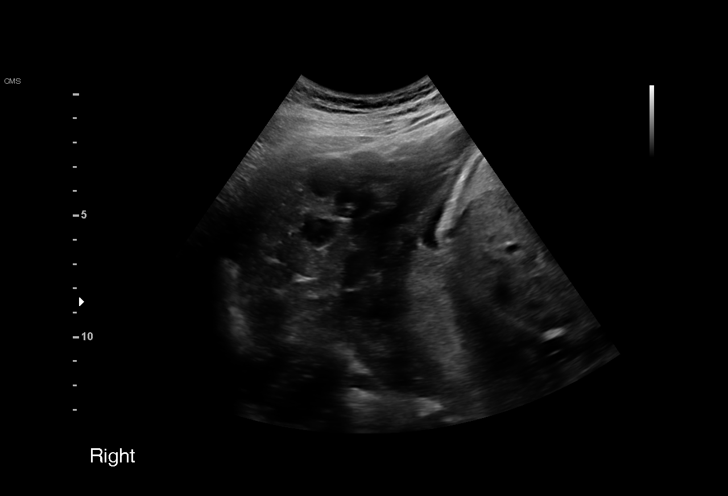
[im 18/43]
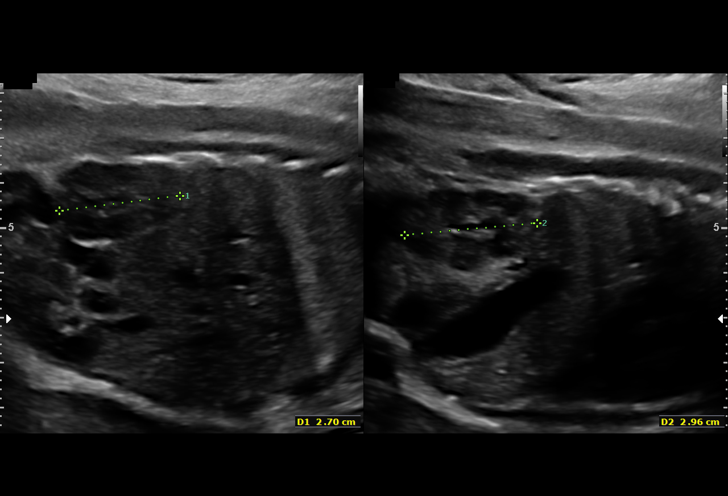
[im 22/43]
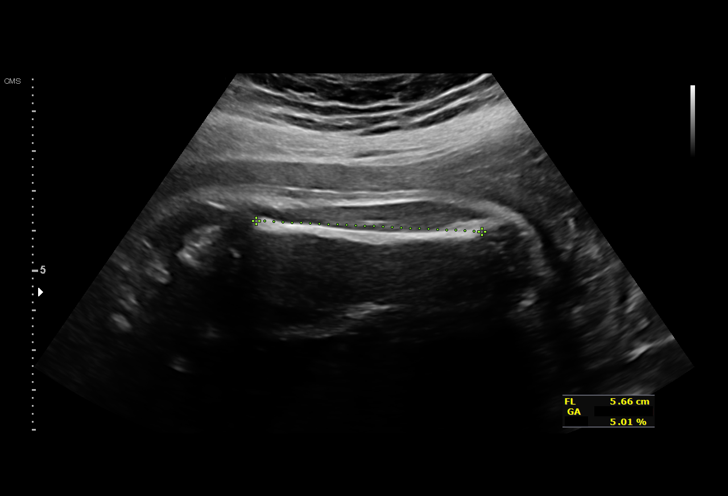
[im 25/43]
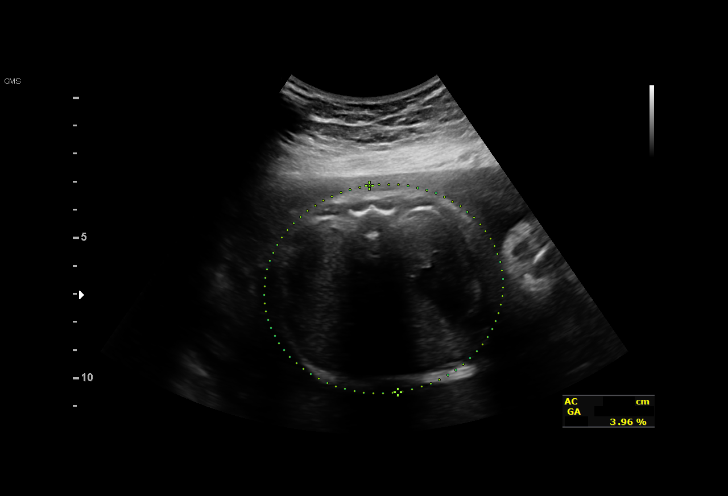
[im 29/43]
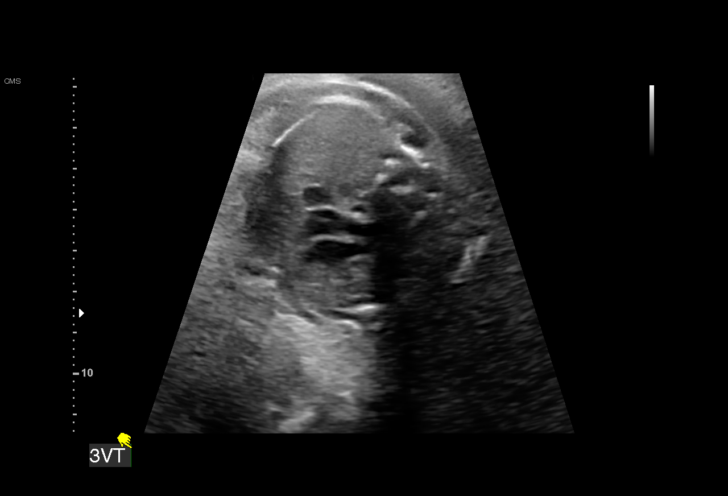
[im 32/43]
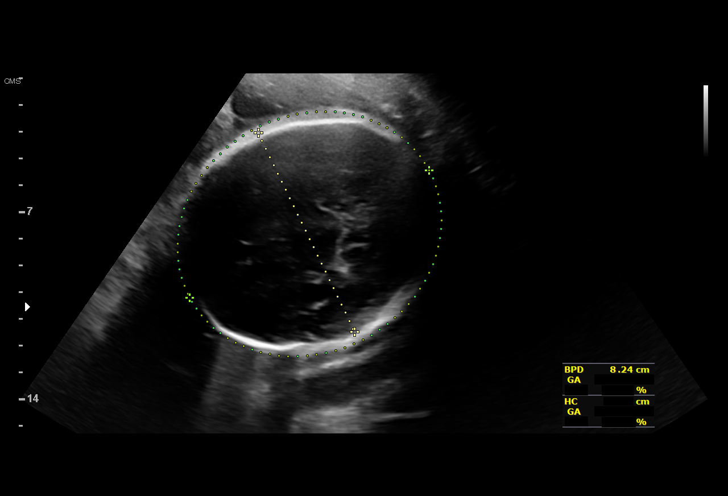
[im 35/43]
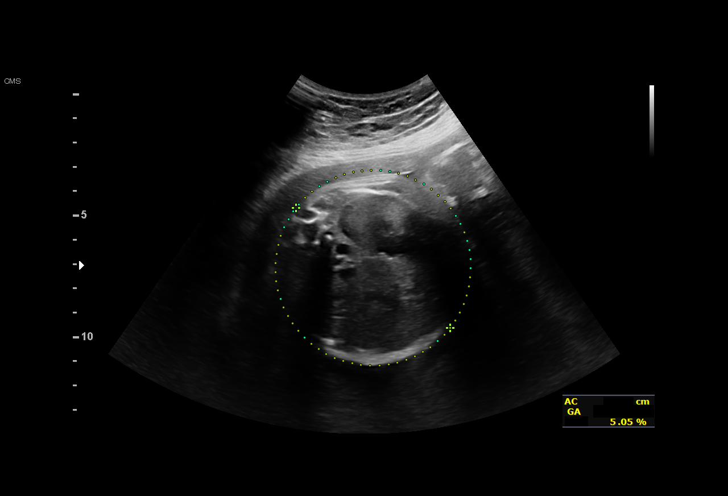
[im 38/43]
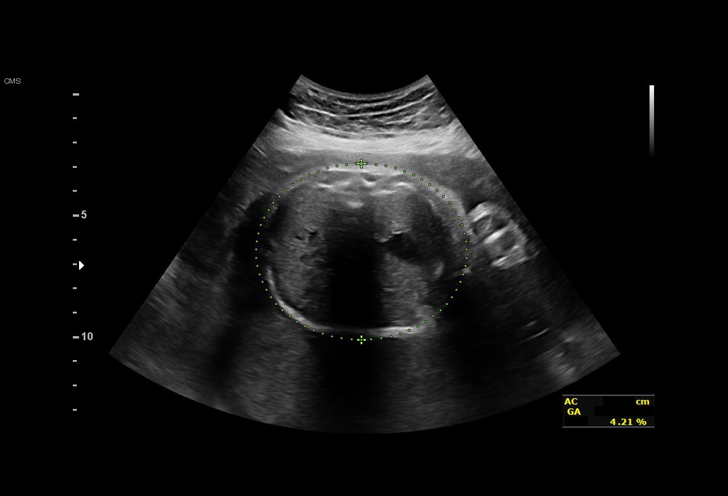
[im 41/43]
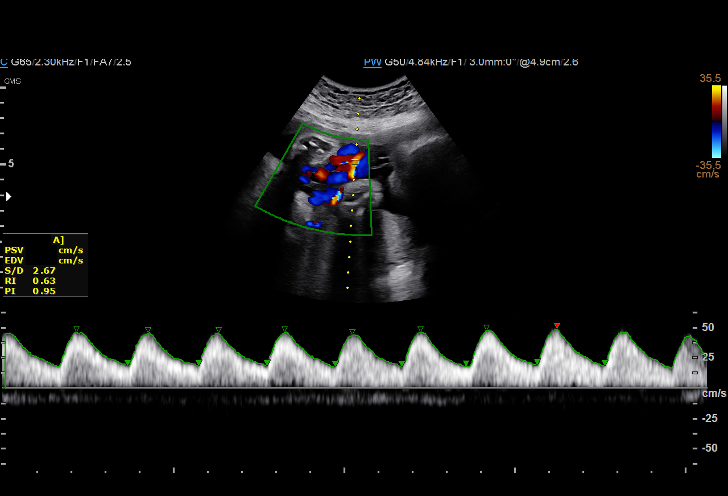

[13 of 28 positions shown; findings below may reference images not displayed]

Indications

 Maternal care for known or suspected poor
 fetal growth, third trimester, not applicable or
 unspecified IUGR
 31 weeks gestation of pregnancy
 Advanced maternal age multigravida 35+,
 third trimester
 History of cesarean delivery, currently
 pregnant (x3)
 Poor obstetric history: Previous preterm
 delivery, antepartum (95weeks)
 Poor obstetric history: Previous
 preeclampsia / eclampsia/gestational HTN
Fetal Evaluation

 Num Of Fetuses:         1
 Fetal Heart Rate(bpm):  145
 Cardiac Activity:       Observed
 Presentation:           Cephalic
 Placenta:               Posterior
 P. Cord Insertion:      Previously Visualized

 Amniotic Fluid
 AFI FV:      Within normal limits

 AFI Sum(cm)     %Tile       Largest Pocket(cm)
 10              15

 RUQ(cm)       RLQ(cm)       LUQ(cm)        LLQ(cm)

Biometry

 BPD:        83  mm     G. Age:  33w 3d         90  %    CI:        77.48   %    70 - 86
                                                         FL/HC:      18.9   %    19.3 -
 HC:      298.5  mm     G. Age:  33w 0d         58  %    HC/AC:      1.18        0.96 -
 AC:      252.4  mm     G. Age:  29w 3d          5  %    FL/BPD:     67.8   %    71 - 87
 FL:       56.3  mm     G. Age:  29w 4d        4.1  %    FL/AC:      22.3   %    20 - 24
 HUM:      50.4  mm     G. Age:  29w 4d         11  %

 Est. FW:    4547  gm      3 lb 5 oz      8  %
OB History

 Gravidity:    5         Term:   2        Prem:   1        SAB:   1
 Living:       2
Gestational Age

 LMP:           31w 3d        Date:  08/19/19                 EDD:   05/25/20
 U/S Today:     31w 3d                                        EDD:   05/25/20
 Best:          31w 3d     Det. By:  LMP  (08/19/19)          EDD:   05/25/20
Anatomy

 Cranium:               Appears normal         Aortic Arch:            Previously seen
 Cavum:                 Previously seen        Ductal Arch:            Previously seen
 Ventricles:            Appears normal         Diaphragm:              Previously seen
 Choroid Plexus:        Appears normal         Stomach:                Appears normal, left
                                                                       sided
 Cerebellum:            Previously seen        Abdomen:                Appears normal
 Posterior Fossa:       Previously seen        Abdominal Wall:         Previously seen
 Nuchal Fold:           Previously seen        Cord Vessels:           Previously seen
 Face:                  Orbits and profile     Kidneys:                Appear normal
                        previously seen
 Lips:                  Previously seen        Bladder:                Previously seen
 Thoracic:              Appears normal         Spine:                  Previously seen
 Heart:                 Appears normal         Upper Extremities:      Previously seen
                        (4CH, axis, and
                        situs)
 RVOT:                  Previously seen        Lower Extremities:      Previously seen
 LVOT:                  Previously seen

 Other:  Technically difficult due to fetal position and maternal habitus.
Doppler - Fetal Vessels

 Umbilical Artery
  S/D     %tile      RI                                      ADFV    RDFV
  3.48       85    0.71                                         No      No

Cervix Uterus Adnexa

 Cervix
 Not visualized (advanced GA >84wks)

 Uterus
 No abnormality visualized.
 Right Ovary
 Not visualized.

 Left Ovary
 Not visualized.

 Cul De Sac
 No free fluid seen.

 Adnexa
 No abnormality visualized.
Comments

 This patient was seen for a follow up growth scan due to fetal
 growth restriction noted during her prior ultrasound exams.
 She denies any problems since her last exam and reports
 feeling vigorous fetal movements throughout the day.
 On today's exam, the EFW measures at the 8th percentile for
 her gestational age indicating fetal growth restriction.  The
 fetus has grown over 1 pound over the past 3 weeks.  There
 was normal amniotic fluid noted.
 Doppler studies of the umbilical arteries showed a normal
 S/D ratio of 3.48. There were no signs of absent or reversed
 end-diastolic flow.
 Due to fetal growth restriction, we will continue to follow her
 with weekly fetal testing and umbilical artery Doppler studies.
 We will reassess the fetal growth again in 3 weeks.
 Another umbilical artery Doppler study and biophysical profile
 scheduled in 1 week.

## 2022-01-15 IMAGING — US US MFM FETAL BPP W/O NON-STRESS
1 series · 15 of 21 positions shown · non-contrast
Comparison: none

[Series 1: us mfm fetal bpp w/o non-stress · 21 acquisitions, 15 frames shown]
[im 1/21]
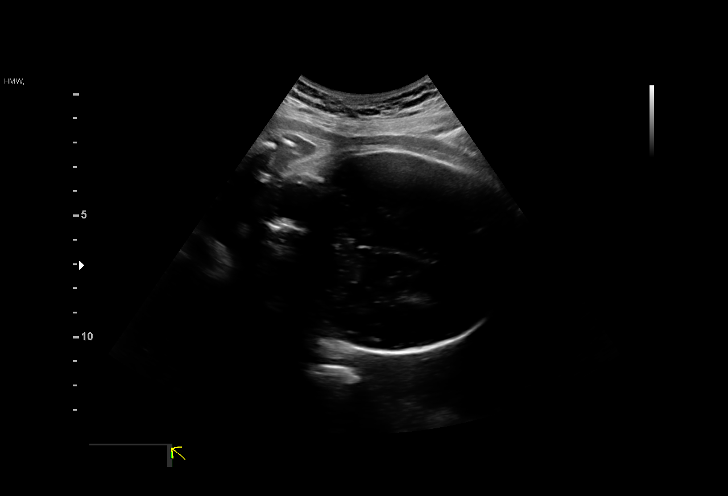
[im 3/21]
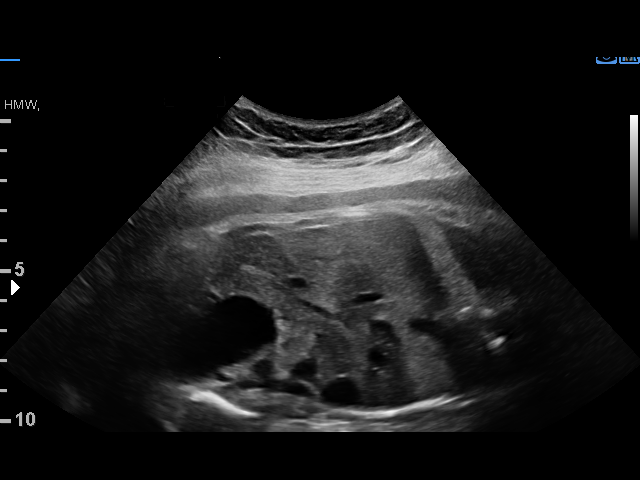
[im 4/21]
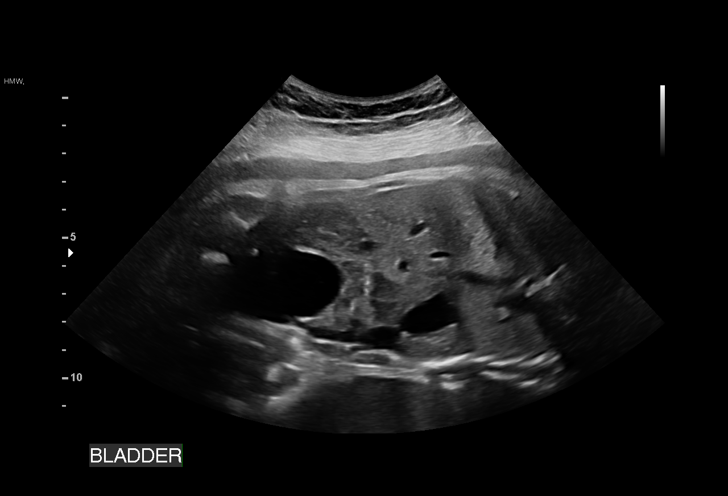
[im 5/21]
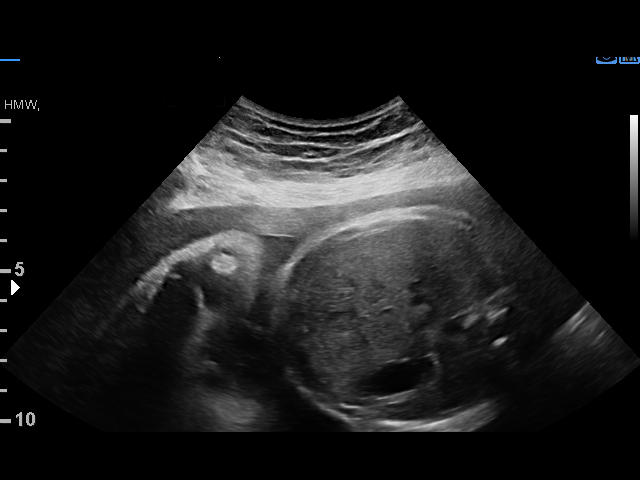
[im 7/21]
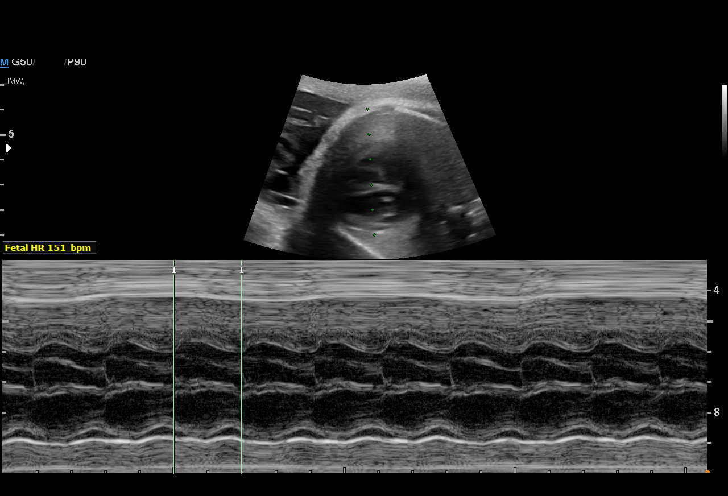
[im 8/21]
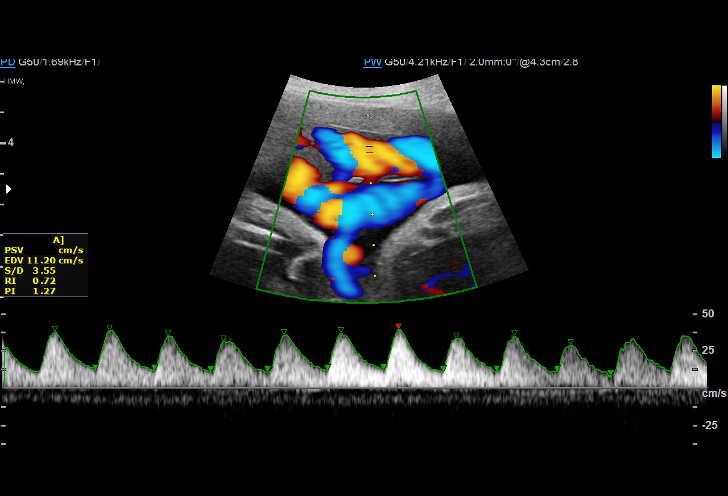
[im 10/21]
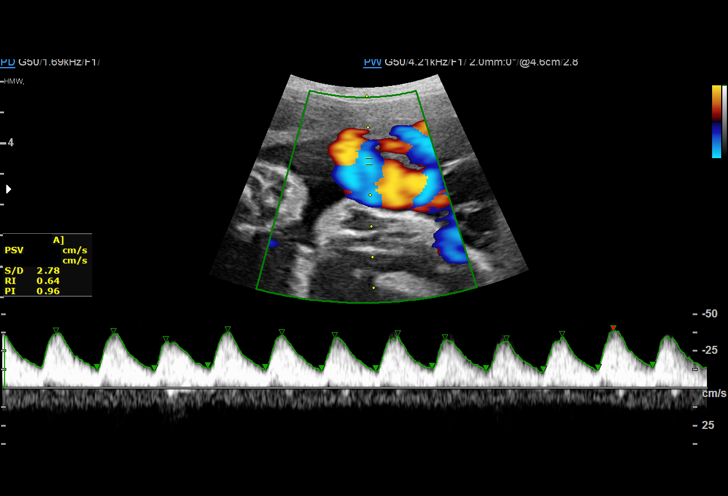
[im 11/21]
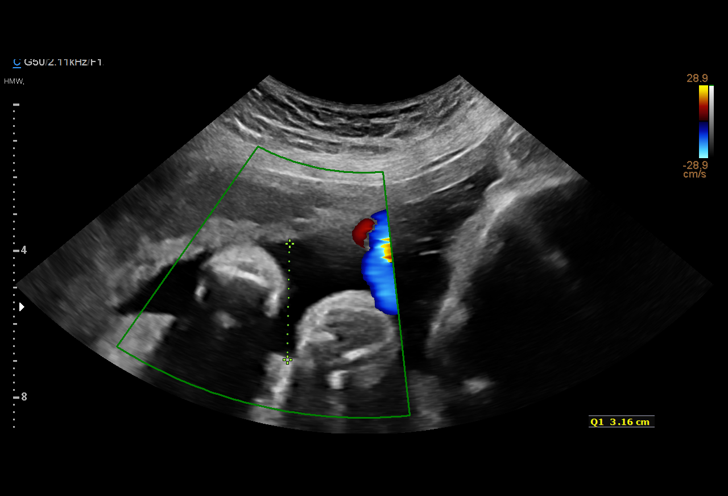
[im 12/21]
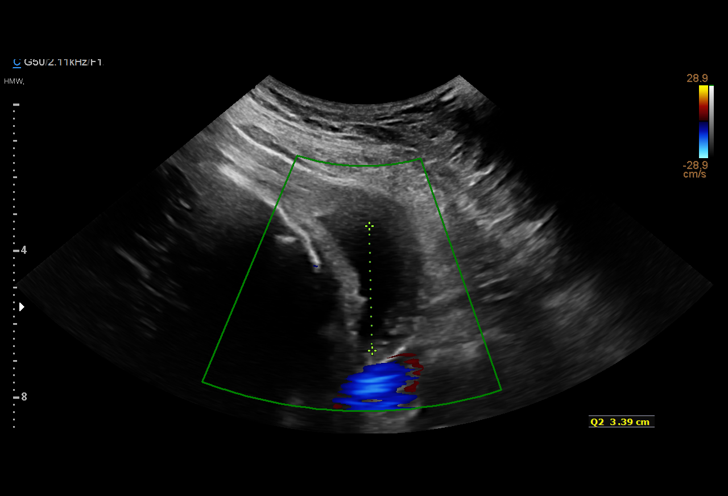
[im 14/21]
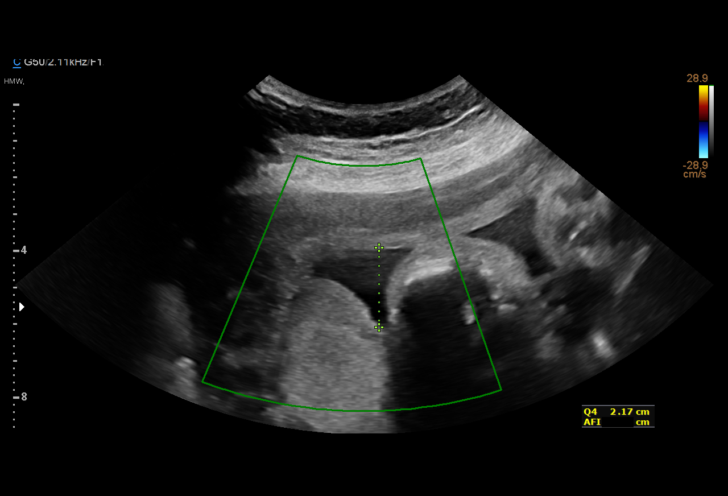
[im 15/21]
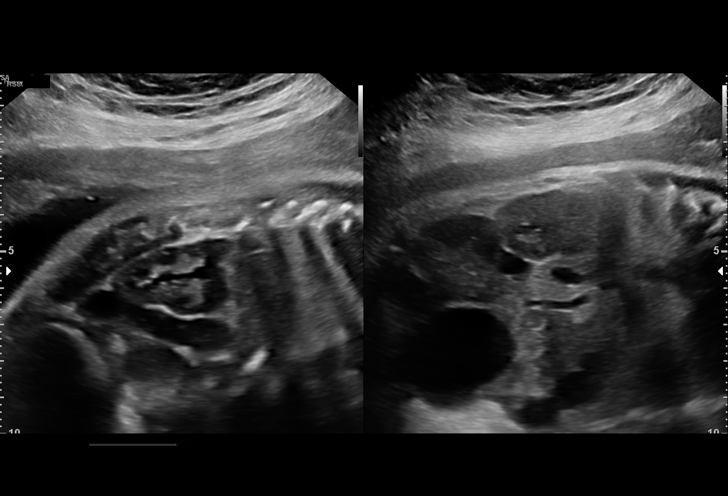
[im 17/21]
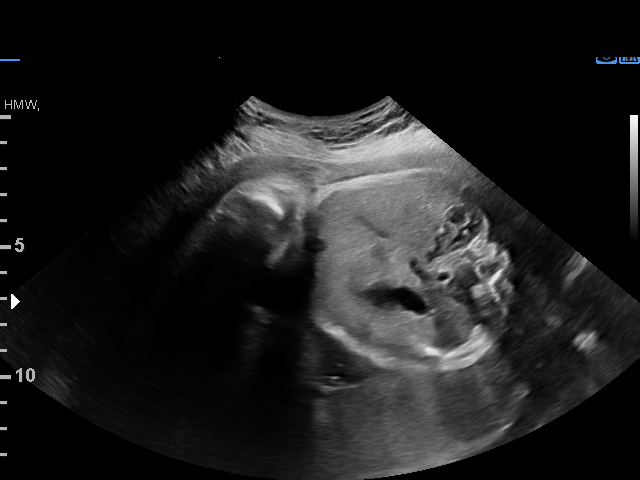
[im 18/21]
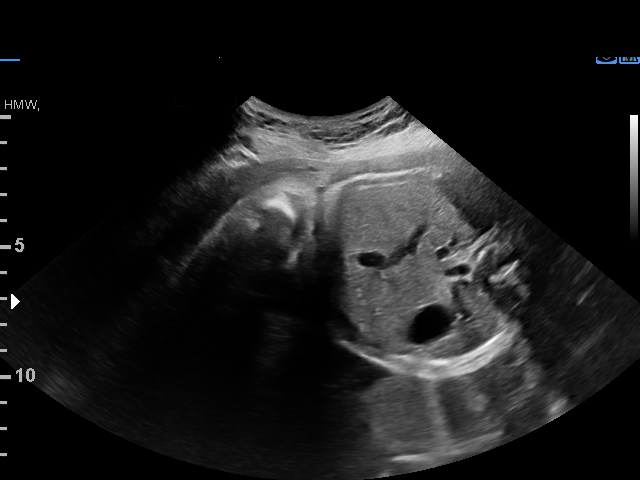
[im 19/21]
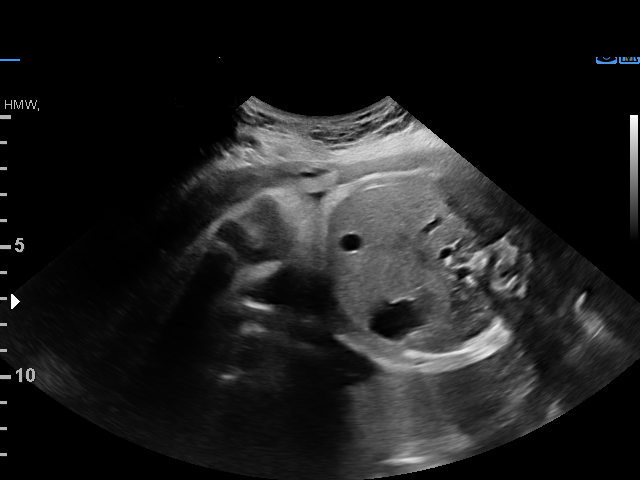
[im 21/21]
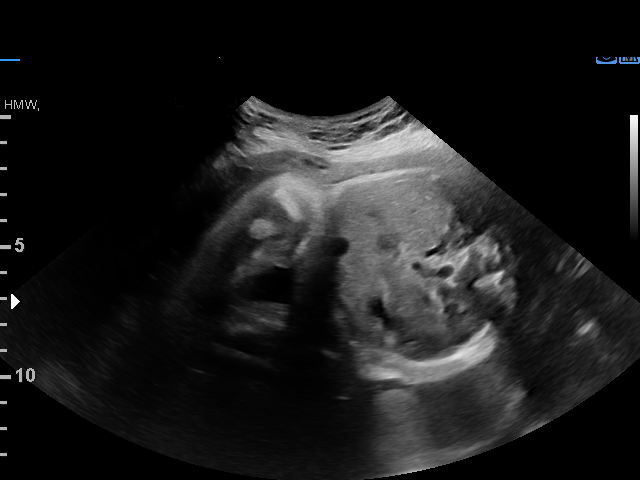

[15 of 21 positions shown; findings below may reference images not displayed]

Indications

 Maternal care for known or suspected poor
 fetal growth, third trimester, not applicable or
 unspecified IUGR
 32 weeks gestation of pregnancy
 Advanced maternal age multigravida 35+,
 third trimester
 History of cesarean delivery, currently
 pregnant (x3)
 Poor obstetric history: Previous preterm
 delivery, antepartum (44weeks)
 Poor obstetric history: Previous
 preeclampsia / eclampsia/gestational HTN
Fetal Evaluation

 Num Of Fetuses:         1
 Fetal Heart Rate(bpm):  151
 Cardiac Activity:       Observed
 Presentation:           Cephalic

 Amniotic Fluid
 AFI FV:      Within normal limits

 AFI Sum(cm)     %Tile       Largest Pocket(cm)
 12.3            34

 RUQ(cm)       RLQ(cm)       LUQ(cm)        LLQ(cm)

Biophysical Evaluation

 Amniotic F.V:   Within normal limits       F. Tone:        Observed
 F. Movement:    Observed                   Score:          [DATE]
 F. Breathing:   Observed
OB History

 Gravidity:    5         Term:   2        Prem:   1        SAB:   1
 Living:       2
Gestational Age

 LMP:           32w 3d        Date:  08/19/19                 EDD:   05/25/20
 Best:          32w 3d     Det. By:  LMP  (08/19/19)          EDD:   05/25/20
Anatomy

 Thoracic:              Appears normal         Bladder:                Appears normal
 Stomach:               Appears normal, left
                        sided
Doppler - Fetal Vessels

 Umbilical Artery
  S/D     %tile      RI                                      ADFV    RDFV
  3.45       87    1.29                                          N       N

Cervix Uterus Adnexa

 Cervix
 Not visualized (advanced GA >31wks)
Impression

 Fetal growth restriction.  On ultrasound performed last week,
 the estimated fetal weight was at the 8 percentile.  Patient
 return for antenatal testing.  Blood pressure today at our
 office is 106/63 mmHg.
 Amniotic fluid is normal and good fetal activity is seen
 .Antenatal testing is reassuring. BPP [DATE]. Umbilical artery
 Doppler showed normal forward diastolic flow .
 We reassured the patient of the findings.
Recommendations

 -Continue weekly BPP and UA Doppler till delivery.
 -Fetal growth assessment in 2 weeks.
                 Molestina, Maria Nube

## 2022-04-24 ENCOUNTER — Other Ambulatory Visit: Payer: Self-pay

## 2022-04-24 ENCOUNTER — Encounter (HOSPITAL_COMMUNITY): Payer: Self-pay | Admitting: Emergency Medicine

## 2022-04-24 ENCOUNTER — Ambulatory Visit (HOSPITAL_COMMUNITY)
Admission: EM | Admit: 2022-04-24 | Discharge: 2022-04-24 | Disposition: A | Discharge status: Home / Self Care | Payer: Medicaid Other | Attending: Internal Medicine | Admitting: Internal Medicine

## 2022-04-24 DIAGNOSIS — R300 Dysuria: Secondary | ICD-10-CM | POA: Diagnosis not present

## 2022-04-24 DIAGNOSIS — R319 Hematuria, unspecified: Secondary | ICD-10-CM | POA: Diagnosis not present

## 2022-04-24 LAB — POCT URINALYSIS DIPSTICK, ED / UC
Bilirubin Urine: NEGATIVE
Glucose, UA: NEGATIVE mg/dL
Ketones, ur: NEGATIVE mg/dL
Nitrite: NEGATIVE
Protein, ur: >=300 mg/dL — AB
Specific Gravity, Urine: 1.01 (ref 1.005–1.030)
Urobilinogen, UA: 0.2 mg/dL (ref 0.0–1.0)
pH: 6 (ref 5.0–8.0)

## 2022-04-24 LAB — POC URINE PREG, ED: Preg Test, Ur: NEGATIVE

## 2022-04-24 MED ORDER — NITROFURANTOIN MONOHYD MACRO 100 MG PO CAPS: 100.0000 mg | ORAL_CAPSULE | Freq: Two times a day (BID) | ORAL | 0 refills | Status: AC

## 2022-04-24 MED ORDER — IBUPROFEN 800 MG PO TABS: 800.0000 mg | ORAL_TABLET | Freq: Three times a day (TID) | ORAL | 0 refills | Status: AC

## 2022-04-24 NOTE — Discharge Instructions (Addendum)
????? ????? ????? ???? ????? ?? ????? ???? ?? ????? ??????? ? ???? ????? ???? ??? ??????? ?????? ?? ??? ???? ????????? ?????? ? ???? ???? ???? ???? ?????? ?? ??????? ??? ?????? ? ????? ??????  ???? ????????   macrobid ????? ?????? ???? 5 ????  ??? ???? ?????? ?????? ?? ????? ?? ??????? ??????? ?????? ???????? ???? ? ????? ?????? ???????? ????????? ??? ????? ????? ?????? ?? ??? ?????  ????? ??????? ?????????? ???? ???? ???? ???????? ?? ????? ??????? ??? ???? ?????? ?????? ????????? ? ???? ?????? ????? ??? ????? ??? ????? ?????????  ?? ?? ????? ??????? ?? ???? ??????? ?????  ??? ?? ????? ?????? ? ??? ?????? ??????? ?????? ? ?????? ?? ?????? ??? ????? ????? ???????? ???????? ??????? ???? ?????? ?? ??????  ??? ?????? ??????? ?? ?????? ??? ??????? ?????? ?? ????? ? ???? ???????? ?????? ????? ?? ????? ??????? ??? ??????  Your urinalysis shows Vanessa Molina blood cells but did not show bacteria  your urine will be sent to the lab to determine if bacteria is present, if any changes need to be made to your medications you will be notified  Begin use of macrobid twice daily for 5 days   Your vaginal swab checking for yeast and bacterial vaginosis is pending, you will be notified of positive results only and medication sent in at that time  You may use over-the-counter pyridium to help minimize your symptoms until antibiotic removes bacteria, this medication will turn your urine orange  Increase your fluid intake through use of water  As always practice good hygiene, wiping front to back and avoidance of scented vaginal products to prevent further irritation  If symptoms continue to persist after use of medication or recur please follow-up with urgent care or your primary doctor as needed

## 2022-04-24 NOTE — ED Triage Notes (Signed)
Blood in urine today.  Reports painful urination.  Denies abdominal pain or back pain.

## 2022-04-24 NOTE — ED Provider Notes (Signed)
[facility]    CSN: [id number] Arrival date & time: 04/24/22  1717      History   Chief Complaint Chief Complaint  Patient presents with   Hematuria    HPI Aquira Staser is a 36 y.o. female.   Patient presents with urinary frequency, hematuria and dysuria beginning today.  Has not attempted treatment of symptoms.  Last menstrual period 6 8, occurring monthly.  Denies urgency, abdominal pain or pressure, flank pain, fevers, chills, vaginal discharge itching or odor.  Arabic interpreter used   Past Medical History:  Diagnosis Date   Diabetes mellitus without complication ([place])    Gestational diabetes    Headaches, cluster    History of pre-eclampsia in prior pregnancy, currently pregnant    Pregnancy induced hypertension     Patient Active Problem List   Diagnosis Date Noted   IUGR (intrauterine growth restriction) affecting care of mother 05/15/2020   Cesarean delivery delivered 05/15/2020   Diet controlled gestational diabetes mellitus (GDM) in third trimester 03/03/2020   History of pre-eclampsia in prior pregnancy, currently pregnant 11/18/2019   Supervision of high risk pregnancy, antepartum 11/11/2019   History of cesarean delivery 05/01/2017   Pregnancy with history of neonatal death Nov 12, 2016   Language barrier 10/12/2016   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    Past Surgical History:  Procedure Laterality Date   CESAREAN SECTION     x2   CESAREAN SECTION N/A 05/01/2017   Procedure: REPEAT CESAREAN SECTION;  Surgeon: Truett Mainland, DO;  Location: [facility];  Service: Obstetrics;  Laterality: N/A;   CESAREAN SECTION N/A 05/15/2020   Procedure: CESAREAN SECTION;  Surgeon: Cherre Blanc, MD;  Location: [facility];  Service: Obstetrics;  Laterality: N/A;    OB History     Gravida  5   Para  4   Term  3   Preterm  1   AB  1   Living  3      SAB  1   IAB  0   Ectopic  0   Multiple  0   Live Births  4             Home Medications    Prior to Admission medications   Medication Sig Start Date End Date Taking? Authorizing Provider  acetaminophen (TYLENOL) 325 MG tablet Take 2 tablets (650 mg total) by mouth every 4 (four) hours as needed for mild pain or headache. 05/17/20   Clarnce Flock, MD  ferrous sulfate 325 (65 FE) MG tablet Take 1 tablet (325 mg total) by mouth every other day. Patient not taking: Reported on 04/24/2022 05/17/20   Clarnce Flock, MD  ibuprofen (ADVIL) 600 MG tablet Take 1 tablet (600 mg total) by mouth every 6 (six) hours as needed for mild pain. 05/17/20   Clarnce Flock, MD  oxyCODONE (OXY IR/ROXICODONE) 5 MG immediate release tablet Take 1 tablet (5 mg total) by mouth every 4 (four) hours as needed for moderate pain. Patient not taking: Reported on 06/25/2020 05/17/20   Clarnce Flock, MD  pantoprazole (PROTONIX) 20 MG tablet Take 1 tablet (20 mg total) by mouth daily. Patient not taking: Reported on 06/25/2020 03/02/20   Constant, Peggy, MD  polyethylene glycol powder (GLYCOLAX/MIRALAX) 17 GM/SCOOP powder Take 17 g by mouth daily as needed. Patient not taking: Reported on 06/25/2020 05/17/20   Clarnce Flock, MD  Prenatal Vit-Fe Fumarate-FA (PREPLUS) 27-1 MG TABS Take 1 tablet by mouth daily.  Patient not taking: Reported on 04/24/2022 11/13/19   Adam Phenix, MD    Family History Family History  Problem Relation Age of Onset   Diabetes Maternal Aunt    Diabetes Maternal Uncle    Hypertension Paternal Aunt    Hypertension Paternal Uncle     Social History Social History   Tobacco Use   Smoking status: Never   Smokeless tobacco: Never  Vaping Use   Vaping Use: Never used  Substance Use Topics   Alcohol use: No   Drug use: No     Allergies   Patient has no known allergies.   Review of Systems Review of Systems  Genitourinary:  Positive for dysuria, frequency and hematuria. Negative for decreased urine volume, difficulty urinating,  dyspareunia, enuresis, flank pain, genital sores, menstrual problem, pelvic pain, urgency, vaginal bleeding, vaginal discharge and vaginal pain.     Physical Exam Triage Vital Signs ED Triage Vitals  Enc Vitals Group     BP 04/24/22 1803 113/76     Pulse Rate 04/24/22 1803 94     Resp 04/24/22 1803 18     Temp 04/24/22 1803 99.2 F (37.3 C)     Temp Source 04/24/22 1803 Oral     SpO2 04/24/22 1803 98 %     Weight --      Height --      Head Circumference --      Peak Flow --      Pain Score 04/24/22 1800 8     Pain Loc --      Pain Edu? --      Excl. in GC? --    No data found.  Updated Vital Signs BP 113/76 (BP Location: Left Arm)   Pulse 94   Temp 99.2 F (37.3 C) (Oral)   Resp 18   LMP 04/13/2022   SpO2 98%   Visual Acuity Right Eye Distance:   Left Eye Distance:   Bilateral Distance:    Right Eye Near:   Left Eye Near:    Bilateral Near:     Physical Exam Constitutional:      Appearance: Normal appearance.  Eyes:     Extraocular Movements: Extraocular movements intact.  Pulmonary:     Effort: Pulmonary effort is normal.  Abdominal:     General: Abdomen is flat. Bowel sounds are normal. There is no distension.     Palpations: Abdomen is soft.     Tenderness: There is abdominal tenderness in the suprapubic area. There is no right CVA tenderness or left CVA tenderness.  Genitourinary:    Comments: deferred Neurological:     Mental Status: She is alert.  Psychiatric:        Mood and Affect: Mood normal.        Behavior: Behavior normal.      UC Treatments / Results  Labs (all labs ordered are listed, but only abnormal results are displayed) Labs Reviewed - No data to display  EKG   Radiology No results found.  Procedures Procedures (including critical care time)  Medications Ordered in UC Medications - No data to display  Initial Impression / Assessment and Plan / UC Course  I have reviewed the triage vital signs and the nursing  notes.  Pertinent labs & imaging results that were available during my care of the patient were reviewed by me and considered in my medical decision making (see chart for details).  Clinical Course as of 04/24/22 1913  Mon Apr 24, 2022  1842 Protein(!): >=300 [AW]    Clinical Course User Index [AW] Valinda Hoar, NP    Dysuria, hematuria  Urinalysis negative for infection chills hemoglobin and Perl Kerney blood cells, sent for culture,vaginal swab checking for yeast and BV pending, discussed findings with patient, based on symptomology we will begin coverage, Macrobid 5-day course prescribed as well as ibuprofen 800 mg, may attempt use of over-the-counter Pyridium as needed for additional support, advised increase fluid intake and good hygiene for additional care, advised abstinence until lab results, may follow-up with his urgent care as needed if symptoms continue to persist Final Clinical Impressions(s) / UC Diagnoses   Final diagnoses:  None   Discharge Instructions   None    ED Prescriptions   None    PDMP not reviewed this encounter.   Valinda Hoar, [place] 04/24/22 [phone]

## 2022-04-25 LAB — URINE CULTURE: Culture: <10000 — AB

## 2022-04-25 LAB — CERVICOVAGINAL ANCILLARY ONLY
Bacterial Vaginitis (gardnerella): NEGATIVE
Candida Glabrata: NEGATIVE
Candida Vaginitis: NEGATIVE
Comment: NEGATIVE
Comment: NEGATIVE
Comment: NEGATIVE

## 2022-04-27 ENCOUNTER — Ambulatory Visit (HOSPITAL_COMMUNITY)
Admission: EM | Admit: 2022-04-27 | Discharge: 2022-04-27 | Discharge status: Against Medical Advice | Payer: Medicaid Other

## 2022-04-27 NOTE — ED Triage Notes (Signed)
Pt registered in error -- via video interpreter: states does not need to be seen, but was here to inquire about the VM msg left for her. Pt informed that as per note and lab results, she can d/c the abx use. Pt states she is feeling better now.  Pt verbalized understanding of above instructions.

## 2022-08-25 ENCOUNTER — Encounter (HOSPITAL_COMMUNITY): Payer: Self-pay | Admitting: Emergency Medicine

## 2022-08-25 ENCOUNTER — Ambulatory Visit (HOSPITAL_COMMUNITY)
Admission: EM | Admit: 2022-08-25 | Discharge: 2022-08-25 | Disposition: A | Discharge status: Home / Self Care | Payer: Self-pay | Attending: Family Medicine | Admitting: Family Medicine

## 2022-08-25 DIAGNOSIS — M25572 Pain in left ankle and joints of left foot: Secondary | ICD-10-CM

## 2022-08-25 NOTE — Discharge Instructions (Signed)
??? ??? ????? ????? ???? ???? ?? ??????. ????? ???? ???? ?????? ????? ?? ??? ????? ?????? ??????? ??? ??????. ?? ????? ?? ?????? ??????? ??? ???? ??? ?? ???. ????? ????????? ?? ????? ??? ???????? ???????? ??????? ??????? ???????? ?????? ?????? ?????. ??? ????? ??? ????? ?? ?????? ????? ??? ?? ???? ???? ??????? ??????? ????? ?? ?????? ??? ???? ?? ???????.   laqad tamat ruyatuk alyawm bisabab alam fi alkahil. nzran lieadam wujud 'iisabatin, 'aetaqid 'ana hadhih mushkilat ailtihab al'awtar ealaa al'arjahi. la 'aetaqid 'ana al'ashieat alsiyniat sawf tuzhir lana 'ay shay'in. '[place] bialabtiead ean qadamayk qadr al'iimkani, walraahati, walthalja, walrafe, waistikhdam mutrin litakhfif al'almi. 'iidhan aistamara hadha al'amr 'aw tafaqumu, fatabae huna 'aw rajie muqadam alrieayat al'awaliat alkhasi bik lilhusul ealaa mazid min alrieayati.  You were seen today for ankle pain.  Given no injury, I think this is likely a tendonitis issue.  I do not think an xray will show [place] anything.  I recommend you stay off of your feet as much as you can, rest, ice, elevate, and use motrin for pain.  If this continues or worsens then follow up here or see your primary care provider for further care.

## 2022-08-25 NOTE — ED Triage Notes (Signed)
Pt c/o left foot pain for 2 days. Denies fall or injury.

## 2022-08-25 NOTE — ED Provider Notes (Signed)
[facility]    CSN: [id number] Arrival date & time: 08/25/22  1333      History   Chief Complaint No chief complaint on file.   HPI Vanessa Molina is a 36 y.o. female.   Patient is here for left foot pain since yesterday.  No known injury.  Was just a little bit yesterday, but today is more pain.  She did take motrin which did help.    Pain is at the heel and around the ankle.  She has been doing more activity around the house       Past Medical History:  Diagnosis Date   Diabetes mellitus without complication ([employer])    Gestational diabetes    Headaches, cluster    History of pre-eclampsia in prior pregnancy, currently pregnant    Pregnancy induced hypertension     Patient Active Problem List   Diagnosis Date Noted   IUGR (intrauterine growth restriction) affecting care of mother 05/15/2020   Cesarean delivery delivered 05/15/2020   Diet controlled gestational diabetes mellitus (GDM) in third trimester 03/03/2020   History of pre-eclampsia in prior pregnancy, currently pregnant 11/18/2019   Supervision of high risk pregnancy, antepartum 11/11/2019   History of cesarean delivery 05/01/2017   Pregnancy with history of neonatal death 21-Nov-2016   Language barrier 10/12/2016   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    Past Surgical History:  Procedure Laterality Date   CESAREAN SECTION     x2   CESAREAN SECTION N/A 05/01/2017   Procedure: REPEAT CESAREAN SECTION;  Surgeon: Truett Mainland, DO;  Location: [facility];  Service: Obstetrics;  Laterality: N/A;   CESAREAN SECTION N/A 05/15/2020   Procedure: CESAREAN SECTION;  Surgeon: Cherre Blanc, MD;  [facility]: [facility];  Service: Obstetrics;  Laterality: N/A;    OB History     Gravida  5   Para  4   Term  3   Preterm  1   AB  1   Living  3      SAB  1   IAB  0   Ectopic  0   Multiple  0   Live Births  4            Home Medications    Prior to Admission  medications   Medication Sig Start Date End Date Taking? Authorizing Provider  acetaminophen (TYLENOL) 325 MG tablet Take 2 tablets (650 mg total) by mouth every 4 (four) hours as needed for mild pain or headache. 05/17/20   Clarnce Flock, MD  ferrous sulfate 325 (65 FE) MG tablet Take 1 tablet (325 mg total) by mouth every other day. Patient not taking: Reported on 04/24/2022 05/17/20   Clarnce Flock, MD  ibuprofen (ADVIL) 800 MG tablet Take 1 tablet (800 mg total) by mouth 3 (three) times daily. 04/24/22   White, Leitha Schuller, NP  nitrofurantoin, macrocrystal-monohydrate, (MACROBID) 100 MG capsule Take 1 capsule (100 mg total) by mouth 2 (two) times daily. 04/24/22   White, Leitha Schuller, NP  oxyCODONE (OXY IR/ROXICODONE) 5 MG immediate release tablet Take 1 tablet (5 mg total) by mouth every 4 (four) hours as needed for moderate pain. Patient not taking: Reported on 06/25/2020 05/17/20   Clarnce Flock, MD  pantoprazole (PROTONIX) 20 MG tablet Take 1 tablet (20 mg total) by mouth daily. Patient not taking: Reported on 06/25/2020 03/02/20   Constant, Peggy, MD  polyethylene glycol powder (GLYCOLAX/MIRALAX) 17 GM/SCOOP powder Take 17 g by mouth  daily as needed. Patient not taking: Reported on 06/25/2020 05/17/20   Venora Maples, MD  Prenatal Vit-Fe Fumarate-FA (PREPLUS) 27-1 MG TABS Take 1 tablet by mouth daily. Patient not taking: Reported on 04/24/2022 11/13/19   Adam Phenix, MD    Family History Family History  Problem Relation Age of Onset   Diabetes Maternal Aunt    Diabetes Maternal Uncle    Hypertension Paternal Aunt    Hypertension Paternal Uncle     Social History Social History   Tobacco Use   Smoking status: Never   Smokeless tobacco: Never  Vaping Use   Vaping Use: Never used  Substance Use Topics   Alcohol use: No   Drug use: No     Allergies   Patient has no known allergies.   Review of Systems Review of Systems  Constitutional: Negative.   HENT:  Negative.    Respiratory: Negative.    Cardiovascular: Negative.   Gastrointestinal: Negative.   Genitourinary: Negative.      Physical Exam Triage Vital Signs ED Triage Vitals  Enc Vitals Group     BP 08/25/22 1448 110/78     Pulse Rate 08/25/22 1448 63     Resp 08/25/22 1448 15     Temp 08/25/22 1448 97.9 F (36.6 C)     Temp Source 08/25/22 1448 Oral     SpO2 08/25/22 1448 100 %     Weight --      Height --      Head Circumference --      Peak Flow --      Pain Score 08/25/22 1447 8     Pain Loc --      Pain Edu? --      Excl. in GC? --    No data found.  Updated Vital Signs BP 110/78 (BP Location: Right Arm)   Pulse 63   Temp 97.9 F (36.6 C) (Oral)   Resp 15   LMP 08/16/2022   SpO2 100%   Visual Acuity Right Eye Distance:   Left Eye Distance:   Bilateral Distance:    Right Eye Near:   Left Eye Near:    Bilateral Near:     Physical Exam Constitutional:      Appearance: Normal appearance.  Cardiovascular:     Rate and Rhythm: Normal rate.  Pulmonary:     Effort: Pulmonary effort is normal.  Musculoskeletal:     Comments: No swelling or deformity to the left ankle;  full rom without pain or limitation.  No obvious TTP on exam.  C/o pain at the lateral ankle with full eversion of the ankle  Skin:    General: Skin is warm.  Neurological:     General: No focal deficit present.     Mental Status: She is alert.  Psychiatric:        Mood and Affect: Mood normal.      UC Treatments / Results  Labs (all labs ordered are listed, but only abnormal results are displayed) Labs Reviewed - No data to display  EKG   Radiology No results found.  Procedures Procedures (including critical care time)  Medications Ordered in UC Medications - No data to display  Initial Impression / Assessment and Plan / UC Course  I have reviewed the triage vital signs and the nursing notes.  Pertinent labs & imaging results that were available during my care of  the patient were reviewed by me and considered in my medical decision  making (see chart for details).    Final Clinical Impressions(s) / UC Diagnoses   Final diagnoses:  Pain of joint of left ankle and foot     Discharge Instructions      ??? ??? ????? ????? ???? ???? ?? ??????. ????? ???? ???? ?????? ????? ?? ??? ????? ?????? ??????? ??? ??????. ?? ????? ?? ?????? ??????? ??? ???? ??? ?? ???. ????? ????????? ?? ????? ??? ???????? ???????? ??????? ??????? ???????? ?????? ?????? ?????. ??? ????? ??? ????? ?? ?????? ????? ??? ?? ???? ???? ??????? ??????? ????? ?? ?????? ??? ???? ?? ???????. laqad tamat ruyatuk alyawm bisabab alam fi alkahil. nzran lieadam wujud 'iisabatin, 'aetaqid 'ana hadhih mushkilat ailtihab al'awtar ealaa al'arjahi. la 'aetaqid 'ana al'ashieat alsiyniat sawf tuzhir lana 'ay shay'in. '[place] bialabtiead ean qadamayk qadr al'iimkani, walraahati, walthalja, walrafe, waistikhdam mutrin litakhfif al'almi. 'iidhan aistamara hadha al'amr 'aw tafaqumu, fatabae huna 'aw rajie muqadam alrieayat al'awaliat alkhasi bik lilhusul ealaa mazid min alrieayati.  You were seen today for ankle pain.  Given no injury, I think this is likely a tendonitis issue.  I do not think an xray will show [place] anything.  I recommend you stay off of your feet as much as you can, rest, ice, elevate, and use motrin for pain.  If this continues or worsens then follow up here or see your primary care provider for further care.       ED Prescriptions   None    PDMP not reviewed this encounter.   Jannifer Franklin, MD 08/25/22 [phone]

## 2022-11-21 DIAGNOSIS — M549 Dorsalgia, unspecified: Secondary | ICD-10-CM | POA: Diagnosis not present

## 2023-01-15 DIAGNOSIS — Z124 Encounter for screening for malignant neoplasm of cervix: Secondary | ICD-10-CM | POA: Diagnosis not present

## 2023-01-15 DIAGNOSIS — Z0000 Encounter for general adult medical examination without abnormal findings: Secondary | ICD-10-CM | POA: Diagnosis not present

## 2023-01-15 DIAGNOSIS — R202 Paresthesia of skin: Secondary | ICD-10-CM | POA: Diagnosis not present

## 2023-01-15 DIAGNOSIS — Z113 Encounter for screening for infections with a predominantly sexual mode of transmission: Secondary | ICD-10-CM | POA: Diagnosis not present

## 2023-01-15 DIAGNOSIS — N926 Irregular menstruation, unspecified: Secondary | ICD-10-CM | POA: Diagnosis not present

## 2023-01-15 DIAGNOSIS — Z8632 Personal history of gestational diabetes: Secondary | ICD-10-CM | POA: Diagnosis not present

## 2023-02-19 DIAGNOSIS — D649 Anemia, unspecified: Secondary | ICD-10-CM | POA: Diagnosis not present

## 2023-02-19 DIAGNOSIS — J309 Allergic rhinitis, unspecified: Secondary | ICD-10-CM | POA: Diagnosis not present

## 2023-02-19 DIAGNOSIS — G4489 Other headache syndrome: Secondary | ICD-10-CM | POA: Diagnosis not present

## 2023-03-27 ENCOUNTER — Encounter: Payer: Self-pay | Admitting: Obstetrics

## 2023-03-27 ENCOUNTER — Ambulatory Visit: Payer: Medicaid Other | Admitting: Obstetrics

## 2023-03-27 VITALS — BP 103/66 | HR 81 | Ht 64.0 in | Wt 162.9 lb

## 2023-03-27 DIAGNOSIS — Z8742 Personal history of other diseases of the female genital tract: Secondary | ICD-10-CM | POA: Diagnosis not present

## 2023-03-27 DIAGNOSIS — Z30011 Encounter for initial prescription of contraceptive pills: Secondary | ICD-10-CM | POA: Diagnosis not present

## 2023-03-27 DIAGNOSIS — Z3009 Encounter for other general counseling and advice on contraception: Secondary | ICD-10-CM

## 2023-03-27 MED ORDER — NORGESTIMATE-ETH ESTRADIOL 0.25-35 MG-MCG PO TABS: 1.0000 | ORAL_TABLET | Freq: Every day | ORAL | 11 refills | Status: AC

## 2023-03-27 NOTE — Progress Notes (Signed)
Patient ID: Vanessa Molina, female   DOB: 10-29-1986, 37 y.o.   MRN: [id number]  Chief Complaint  Patient presents with   Menstrual Problem    HPI Vanessa Molina is a 37 y.o. female.  Complains of irregular periods, and sometimes 2 periods a month. HPI  Past Medical History:  Diagnosis Date   Diabetes mellitus without complication (HCC)    Gestational diabetes    Headaches, cluster    History of pre-eclampsia in prior pregnancy, currently pregnant    Pregnancy induced hypertension     Past Surgical History:  Procedure Laterality Date   CESAREAN SECTION     x2   CESAREAN SECTION N/A 05/01/2017   Procedure: REPEAT CESAREAN SECTION;  Surgeon: Levie Heritage, DO;  [facility]: [facility];  Service: Obstetrics;  Laterality: N/A;   CESAREAN SECTION N/A 05/15/2020   Procedure: CESAREAN SECTION;  Surgeon: Malachy Chamber, MD;  Location: [facility];  Service: Obstetrics;  Laterality: N/A;    Family History  Problem Relation Age of Onset   Diabetes Maternal Aunt    Diabetes Maternal Uncle    Hypertension Paternal Aunt    Hypertension Paternal Uncle     Social History Social History   Tobacco Use   Smoking status: Never   Smokeless tobacco: Never  Vaping Use   Vaping Use: Never used  Substance Use Topics   Alcohol use: No   Drug use: No    No Known Allergies  Current Outpatient Medications  Medication Sig Dispense Refill   acetaminophen (TYLENOL) 325 MG tablet Take 2 tablets (650 mg total) by mouth every 4 (four) hours as needed for mild pain or headache. 30 tablet 0   ibuprofen (ADVIL) 800 MG tablet Take 1 tablet (800 mg total) by mouth 3 (three) times daily. 50 tablet 0   norgestimate-ethinyl estradiol (ORTHO-CYCLEN) 0.25-35 MG-MCG tablet Take 1 tablet by mouth daily. 28 tablet 11   ferrous sulfate 325 (65 FE) MG tablet Take 1 tablet (325 mg total) by mouth every other day. (Patient not taking: Reported on 04/24/2022) 30 tablet 1   nitrofurantoin,  macrocrystal-monohydrate, (MACROBID) 100 MG capsule Take 1 capsule (100 mg total) by mouth 2 (two) times daily. (Patient not taking: Reported on 03/27/2023) 10 capsule 0   oxyCODONE (OXY IR/ROXICODONE) 5 MG immediate release tablet Take 1 tablet (5 mg total) by mouth every 4 (four) hours as needed for moderate pain. (Patient not taking: Reported on 06/25/2020) 20 tablet 0   pantoprazole (PROTONIX) 20 MG tablet Take 1 tablet (20 mg total) by mouth daily. (Patient not taking: Reported on 06/25/2020) 30 tablet 3   polyethylene glycol powder (GLYCOLAX/MIRALAX) 17 GM/SCOOP powder Take 17 g by mouth daily as needed. (Patient not taking: Reported on 06/25/2020) 510 g 1   Prenatal Vit-Fe Fumarate-FA (PREPLUS) 27-1 MG TABS Take 1 tablet by mouth daily. (Patient not taking: Reported on 04/24/2022) 30 tablet 13   No current facility-administered medications for this visit.    Review of Systems Review of Systems Constitutional: negative for fatigue and weight loss Respiratory: negative for cough and wheezing Cardiovascular: negative for chest pain, fatigue and palpitations Gastrointestinal: negative for abdominal pain and change in bowel habits Genitourinary:negative Integument/breast: negative for nipple discharge Musculoskeletal:negative for myalgias Neurological: negative for gait problems and tremors Behavioral/Psych: negative for abusive relationship, depression Endocrine: negative for temperature intolerance      Blood pressure 103/66, pulse 81, height 5\' 4"  (1.626 m), weight 162 lb 14.4 oz (73.9 kg), last menstrual period  03/09/2023, not currently breastfeeding.  Physical Exam Physical Exam General:   alert  Skin:   no rash or abnormalities  Lungs:   clear to auscultation bilaterally  Heart:   regular rate and rhythm, S1, S2 normal, no murmur, click, rub or gallop  Breasts:   normal without suspicious masses, skin or nipple changes or axillary nodes  Abdomen:  normal findings: no organomegaly,  soft, non-tender and no hernia  Pelvis:  External genitalia: normal general appearance Urinary system: urethral meatus normal and bladder without fullness, nontender Vaginal: normal without tenderness, induration or masses Cervix: normal appearance Adnexa: normal bimanual exam Uterus: anteverted and non-tender, normal size    I have spent a total of 20 minutes of face-to-face and time, excluding clinical staff time, reviewing notes and preparing to see patient, ordering tests and/or medications, and counseling the patient.   Data Reviewed Labs  Assessment     1. General counseling and advice on female contraception - options discussed - wants OCP's  2. Encounter for initial prescription of contraceptive pills Rx: - norgestimate-ethinyl estradiol (ORTHO-CYCLEN) 0.25-35 MG-MCG tablet; Take 1 tablet by mouth daily.  Dispense: 28 tablet; Refill: 11  3. History of irregular menstrual cycles Rx: - US PELVIC COMPLETE WITH TRANSVAGINAL; Future     Plan   Follow up in 2 weeks  Orders Placed This Encounter  Procedures   US PELVIC COMPLETE WITH TRANSVAGINAL    Standing Status:   Future    Standing Expiration Date:   03/26/2024    Order Specific Question:   Reason for Exam (SYMPTOM  OR DIAGNOSIS REQUIRED)    Answer:   Irregular periods    Order Specific Question:   Preferred imaging location?    Answer:   [facility]   Meds ordered this encounter  Medications   norgestimate-ethinyl estradiol (ORTHO-CYCLEN) 0.25-35 MG-MCG tablet    Sig: Take 1 tablet by mouth daily.    Dispense:  28 tablet    Refill:  11     Brock Bad, MD 03/27/2023 9:58 AM

## 2023-03-27 NOTE — Progress Notes (Signed)
Patient presents to discuss irregular cycles Last Pap: Patient reports 01/15/2023 with [facility], reports normal Contraception: Condoms Vaginal/Urinary Symptoms: Denies Other Concerns: Irregular cycles- having up to 2 periods up a  month and periods are lasting 7-10 days for the last year. Does not desire hormonal birth control.

## 2023-03-28 ENCOUNTER — Ambulatory Visit (HOSPITAL_BASED_OUTPATIENT_CLINIC_OR_DEPARTMENT_OTHER)
Admission: RE | Admit: 2023-03-28 | Discharge: 2023-03-28 | Disposition: A | Discharge status: Home / Self Care | Payer: Medicaid Other | Source: Ambulatory Visit | Attending: Obstetrics | Admitting: Obstetrics

## 2023-03-28 DIAGNOSIS — N920 Excessive and frequent menstruation with regular cycle: Secondary | ICD-10-CM | POA: Diagnosis not present

## 2023-03-28 DIAGNOSIS — Z8742 Personal history of other diseases of the female genital tract: Secondary | ICD-10-CM | POA: Diagnosis not present

## 2023-04-12 ENCOUNTER — Telehealth: Payer: Medicaid Other | Admitting: Obstetrics

## 2023-04-17 ENCOUNTER — Encounter: Payer: Self-pay | Admitting: Obstetrics

## 2023-04-17 ENCOUNTER — Telehealth (INDEPENDENT_AMBULATORY_CARE_PROVIDER_SITE_OTHER): Payer: Medicaid Other | Admitting: Obstetrics

## 2023-04-17 DIAGNOSIS — Z3009 Encounter for other general counseling and advice on contraception: Secondary | ICD-10-CM

## 2023-04-17 DIAGNOSIS — Z8742 Personal history of other diseases of the female genital tract: Secondary | ICD-10-CM | POA: Diagnosis not present

## 2023-04-17 NOTE — Progress Notes (Signed)
GYNECOLOGY VIRTUAL VISIT ENCOUNTER NOTE  [facility]   Patient location: Home  I connected with Vanessa Molina on 04/17/23 at 11:15 AM EDT by MyChart Video Encounter and verified that I am speaking with the correct person using two identifiers.   I discussed the limitations, risks, security and privacy concerns of performing an evaluation and management service virtually and the availability of in person appointments. I also discussed with the patient that there may be a patient responsible charge related to this service. The patient expressed understanding and agreed to proceed.   History:  Vanessa Molina is a 37 y.o. [phone] female being evaluated today for follow up for irregular cycles.   Started on OCP's and ultrasound ordered.. She denies any abnormal vaginal discharge, bleeding, pelvic pain or other concerns.       Past Medical History:  Diagnosis Date   Diabetes mellitus without complication (HCC)    Gestational diabetes    Headaches, cluster    History of pre-eclampsia in prior pregnancy, currently pregnant    Pregnancy induced hypertension    Past Surgical History:  Procedure Laterality Date   CESAREAN SECTION     x2   CESAREAN SECTION N/A 05/01/2017   Procedure: REPEAT CESAREAN SECTION;  Surgeon: Levie Heritage, DO;  [facility];  Service: Obstetrics;  Laterality: N/A;   CESAREAN SECTION N/A 05/15/2020   Procedure: CESAREAN SECTION;  Surgeon: Malachy Chamber, MD;  Location: [facility];  Service: Obstetrics;  Laterality: N/A;   The following portions of the patient's history were reviewed and updated as appropriate: allergies, current medications, past family history, past medical history, past social history, past surgical history and problem list.   Health Maintenance:  Normal pap and negative HRHPV on 2021.     Review of Systems:  Pertinent items noted in HPI and remainder of comprehensive ROS otherwise  negative.  Physical Exam:   General:  Alert, oriented and cooperative. Patient appears to be in no acute distress.  Mental Status: Normal mood and affect. Normal behavior. Normal judgment and thought content.   Respiratory: Normal respiratory effort, no problems with respiration noted  Rest of physical exam deferred due to type of encounter  Labs and Imaging No results found for this or any previous visit (from the past 336 hour(s)). US PELVIC COMPLETE WITH TRANSVAGINAL  Result Date: 03/28/2023 CLINICAL DATA:  Irregular menstruation EXAM: TRANSABDOMINAL AND TRANSVAGINAL ULTRASOUND OF PELVIS TECHNIQUE: Both transabdominal and transvaginal ultrasound examinations of the pelvis were performed. Transabdominal technique was performed for global imaging of the pelvis including uterus, ovaries, adnexal regions, and pelvic cul-de-sac. It was necessary to proceed with endovaginal exam following the transabdominal exam to visualize the endometrium. COMPARISON:  None Available. FINDINGS: Uterus Measurements: 8.4 x 4.7 x 5.5 cm = volume: 111.7 mL. No fibroids or other mass visualized. Endometrium Thickness: 10.3 mL.  No focal abnormality visualized. Right ovary Measurements: 2.9 x 1.9 x 2.4 cm = volume: 6.9 mL. Normal appearance/no adnexal mass. Left ovary Measurements: 2.2 x 1.3 x 1.3 cm = volume: 1.9 mL. Normal appearance/no adnexal mass. Other findings No abnormal free fluid. IMPRESSION: Unremarkable pelvic ultrasound. Endometrium measures 10 mm. If bleeding remains unresponsive to hormonal or medical therapy, sonohysterogram should be considered for focal lesion work-up. (Ref: Radiological Reasoning: Algorithmic Workup of Abnormal Vaginal Bleeding with Endovaginal Sonography and Sonohysterography. AJR 2008; 454:U98-11) Electronically Signed   By: Annia Belt M.D.   On: 03/28/2023 16:44  Assessment and Plan:     1. History of irregular menstrual cycles - contin8ue OCP's  2. General counseling and  advice on female contraception - wants to continue OCP's       I discussed the assessment and treatment plan with the patient. The patient was provided an opportunity to ask questions and all were answered. The patient agreed with the plan and demonstrated an understanding of the instructions.   The patient was advised to call back or seek an in-person evaluation/go to the ED if the symptoms worsen or if the condition fails to improve as anticipated.  I have spent a total of 10 minutes of non-face-to-face time, excluding clinical staff time, reviewing notes and preparing to see patient, ordering tests and/or medications, and counseling the patient.    Coral Ceo, MD [facility], [facility], 04/17/23

## 2023-04-30 DIAGNOSIS — D649 Anemia, unspecified: Secondary | ICD-10-CM | POA: Diagnosis not present

## 2023-04-30 DIAGNOSIS — N926 Irregular menstruation, unspecified: Secondary | ICD-10-CM | POA: Diagnosis not present

## 2023-04-30 DIAGNOSIS — R7303 Prediabetes: Secondary | ICD-10-CM | POA: Diagnosis not present

## 2023-07-31 DIAGNOSIS — M549 Dorsalgia, unspecified: Secondary | ICD-10-CM | POA: Diagnosis not present

## 2023-07-31 DIAGNOSIS — D649 Anemia, unspecified: Secondary | ICD-10-CM | POA: Diagnosis not present

## 2023-07-31 DIAGNOSIS — E663 Overweight: Secondary | ICD-10-CM | POA: Diagnosis not present

## 2023-07-31 DIAGNOSIS — R7303 Prediabetes: Secondary | ICD-10-CM | POA: Diagnosis not present

## 2023-07-31 DIAGNOSIS — N926 Irregular menstruation, unspecified: Secondary | ICD-10-CM | POA: Diagnosis not present

## 2024-01-16 DIAGNOSIS — R7303 Prediabetes: Secondary | ICD-10-CM | POA: Diagnosis not present

## 2024-01-16 DIAGNOSIS — Z0000 Encounter for general adult medical examination without abnormal findings: Secondary | ICD-10-CM | POA: Diagnosis not present

## 2024-01-16 DIAGNOSIS — N926 Irregular menstruation, unspecified: Secondary | ICD-10-CM | POA: Diagnosis not present

## 2024-01-16 DIAGNOSIS — Z113 Encounter for screening for infections with a predominantly sexual mode of transmission: Secondary | ICD-10-CM | POA: Diagnosis not present

## 2024-01-16 DIAGNOSIS — Z862 Personal history of diseases of the blood and blood-forming organs and certain disorders involving the immune mechanism: Secondary | ICD-10-CM | POA: Diagnosis not present

## 2024-01-16 DIAGNOSIS — E785 Hyperlipidemia, unspecified: Secondary | ICD-10-CM | POA: Diagnosis not present
